# Patient Record
Sex: Female | Born: 1970 | Race: White | Hispanic: No | Marital: Single | Smoking: Former smoker
Health system: Southern US, Community
[De-identification: ages and names within clinical notes are randomized; demographics above are authoritative.]

## PROBLEM LIST (undated history)

## (undated) DIAGNOSIS — G2581 Restless legs syndrome: Secondary | ICD-10-CM

## (undated) DIAGNOSIS — E669 Obesity, unspecified: Secondary | ICD-10-CM

## (undated) DIAGNOSIS — G629 Polyneuropathy, unspecified: Secondary | ICD-10-CM

## (undated) DIAGNOSIS — D649 Anemia, unspecified: Secondary | ICD-10-CM

## (undated) DIAGNOSIS — K219 Gastro-esophageal reflux disease without esophagitis: Secondary | ICD-10-CM

## (undated) HISTORY — DX: Gastro-esophageal reflux disease without esophagitis: K21.9

## (undated) HISTORY — DX: Restless legs syndrome: G25.81

## (undated) HISTORY — DX: Polyneuropathy, unspecified: G62.9

## (undated) HISTORY — DX: Anemia, unspecified: D64.9

## (undated) HISTORY — DX: Obesity, unspecified: E66.9

---
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
## 1997-12-10 ENCOUNTER — Emergency Department (HOSPITAL_COMMUNITY): Admission: EM | Admit: 1997-12-10 | Discharge: 1997-12-10 | Payer: Self-pay | Admitting: Emergency Medicine

## 1998-08-15 ENCOUNTER — Ambulatory Visit (HOSPITAL_COMMUNITY): Admission: RE | Admit: 1998-08-15 | Discharge: 1998-08-15 | Payer: Self-pay | Admitting: Gastroenterology

## 1998-12-02 ENCOUNTER — Ambulatory Visit (HOSPITAL_COMMUNITY): Admission: RE | Admit: 1998-12-02 | Discharge: 1998-12-02 | Payer: Self-pay | Admitting: Gastroenterology

## 2002-07-12 ENCOUNTER — Ambulatory Visit (HOSPITAL_BASED_OUTPATIENT_CLINIC_OR_DEPARTMENT_OTHER): Admission: RE | Admit: 2002-07-12 | Discharge: 2002-07-12 | Payer: Self-pay | Admitting: Family Medicine

## 2004-11-20 ENCOUNTER — Encounter: Admission: RE | Admit: 2004-11-20 | Discharge: 2004-11-20 | Payer: Self-pay | Admitting: Internal Medicine

## 2005-02-27 ENCOUNTER — Encounter: Payer: Self-pay | Admitting: Family Medicine

## 2005-02-27 ENCOUNTER — Emergency Department (HOSPITAL_COMMUNITY): Admission: EM | Admit: 2005-02-27 | Discharge: 2005-02-28 | Payer: Self-pay | Admitting: Emergency Medicine

## 2006-01-13 ENCOUNTER — Other Ambulatory Visit: Admission: RE | Admit: 2006-01-13 | Discharge: 2006-01-13 | Payer: Self-pay | Admitting: Gynecology

## 2006-05-28 ENCOUNTER — Encounter (INDEPENDENT_AMBULATORY_CARE_PROVIDER_SITE_OTHER): Payer: Self-pay | Admitting: Specialist

## 2006-05-28 ENCOUNTER — Ambulatory Visit (HOSPITAL_COMMUNITY): Admission: RE | Admit: 2006-05-28 | Discharge: 2006-05-28 | Payer: Self-pay | Admitting: *Deleted

## 2007-01-17 ENCOUNTER — Other Ambulatory Visit: Admission: RE | Admit: 2007-01-17 | Discharge: 2007-01-17 | Payer: Self-pay | Admitting: Gynecology

## 2009-06-14 ENCOUNTER — Other Ambulatory Visit: Admission: RE | Admit: 2009-06-14 | Discharge: 2009-06-14 | Payer: Self-pay | Admitting: Obstetrics and Gynecology

## 2009-06-14 ENCOUNTER — Ambulatory Visit: Payer: Self-pay | Admitting: Women's Health

## 2010-03-09 ENCOUNTER — Encounter: Payer: Self-pay | Admitting: Internal Medicine

## 2010-04-25 ENCOUNTER — Ambulatory Visit (INDEPENDENT_AMBULATORY_CARE_PROVIDER_SITE_OTHER): Payer: 59 | Admitting: Gynecology

## 2010-04-25 DIAGNOSIS — Z30431 Encounter for routine checking of intrauterine contraceptive device: Secondary | ICD-10-CM

## 2010-05-22 ENCOUNTER — Ambulatory Visit: Payer: 59 | Admitting: Gynecology

## 2010-05-26 ENCOUNTER — Ambulatory Visit: Payer: 59 | Admitting: Gynecology

## 2010-05-30 ENCOUNTER — Ambulatory Visit (INDEPENDENT_AMBULATORY_CARE_PROVIDER_SITE_OTHER): Payer: 59 | Admitting: Gynecology

## 2010-05-30 DIAGNOSIS — Z30431 Encounter for routine checking of intrauterine contraceptive device: Secondary | ICD-10-CM

## 2010-07-04 NOTE — Op Note (Signed)
NAMEALLISSON, SCHINDEL NO.:  [id number]   MEDICAL RECORD NO.:  [id number]          PATIENT TYPE:  AMB   LOCATION:  ENDO                         FACILITY:  [facility]   PHYSICIAN:  Georgiana Spinner, M.D.    DATE OF BIRTH:  05/09/1970   DATE OF PROCEDURE:  05/28/2006  DATE OF DISCHARGE:                               OPERATIVE REPORT   PROCEDURE:  Upper endoscopy with biopsy.   INDICATIONS:  Gastroesophageal reflux disease.   ANESTHESIA:  Fentanyl 75 mcg, Versed 6 mg.   DESCRIPTION OF PROCEDURE:  With the patient mildly sedated in the left  lateral decubitus position, the Pentax videoscopic endoscope was  inserted and passed under direct vision through the esophagus, which  appeared normal except there was one small area of a satellite of  intestinal appearing tissue above the squamocolumnar junction, which was  photographed and biopsied.  We entered into the stomach.  Fundus, body,  antrum, duodenal bulb, second portion duodenum appeared normal.  From  this point the endoscope was slowly withdrawn, taking circumferential  views of duodenal mucosa until the endoscope had been pulled back into  the stomach, placed in retroflexion to view the stomach from below.  The  endoscope was straightened and withdrawn, taking circumferential views  of the remaining gastric and esophageal mucosa.  The patient's vital  signs, pulse oximeter remained stable.  The patient tolerated procedure  well without apparent complications.   FINDINGS:  Question of Barrett's esophagus biopsied.  Await biopsy  report.  The patient will call me for results and follow up with me as  an outpatient.           ______________________________  Georgiana Spinner, M.D.     GMO/MEDQ  D:  05/28/2006  T:  05/28/2006  Job:  [id number]

## 2010-09-01 ENCOUNTER — Encounter: Payer: Self-pay | Admitting: *Deleted

## 2010-09-12 ENCOUNTER — Encounter: Payer: 59 | Admitting: Women's Health

## 2011-03-16 ENCOUNTER — Encounter: Payer: Self-pay | Admitting: Women's Health

## 2011-03-16 ENCOUNTER — Ambulatory Visit (INDEPENDENT_AMBULATORY_CARE_PROVIDER_SITE_OTHER): Payer: 59 | Admitting: Women's Health

## 2011-03-16 VITALS — BP 120/80 | Ht 62.0 in | Wt 252.0 lb

## 2011-03-16 DIAGNOSIS — Z01419 Encounter for gynecological examination (general) (routine) without abnormal findings: Secondary | ICD-10-CM

## 2011-03-16 NOTE — Progress Notes (Signed)
Debra Gibson [birth year] [id number]    History:    The patient presents for annual exam.  Amenorrheic on Mirena IUD, placed March 2012. History of normal Paps.  Past medical history, past surgical history, family history and social history were all reviewed and documented in the EPIC chart. Diabetes, anxiety/depression, reflux, primary care manages.   ROS:  A  ROS was performed and pertinent positives and negatives are included in the history.  Exam:  Filed Vitals:   03/16/11 1501  BP: 120/80    General appearance:  Normal Head/Neck:  Normal, without cervical or supraclavicular adenopathy. Thyroid:  Symmetrical, normal in size, without palpable masses or nodularity. Respiratory  Effort:  Normal  Auscultation:  Clear without wheezing or rhonchi Cardiovascular  Auscultation:  Regular rate, without rubs, murmurs or gallops  Edema/varicosities:  Not grossly evident Abdominal  Soft,nontender, without masses, guarding or rebound.  Liver/spleen:  No organomegaly noted  Hernia:  None appreciated  Skin  Inspection:  Grossly normal  Palpation:  Grossly normal Neurologic/psychiatric  Orientation:  Normal with appropriate conversation.  Mood/affect:  Normal  Genitourinary    Breasts: Examined lying and sitting.     Right: Without masses, retractions, discharge or axillary adenopathy.     Left: Without masses, retractions, discharge or axillary adenopathy.   Inguinal/mons:  Normal without inguinal adenopathy  External genitalia:  Normal  BUS/Urethra/Skene's glands:  Normal  Bladder:  Normal  Vagina:  Normal  Cervix:  Normal/IUD string visible  Uterus:   normal in size, shape and contour.  Midline and mobile  Adnexa/parametria:     Rt: Without masses or tenderness.   Lt: Without masses or tenderness.  Anus and perineum: Normal  Digital rectal exam: Normal sphincter tone without palpated masses or tenderness  Assessment/Plan:  41 y.o. SWF G1 P1 for annual exam.   Normal GYN  exam/Mirena IUD Morbid obesity Diabetes/anxiety/depression/reflux-primary care labs and medications  Plan: Condoms encouraged to become sexually active. Aware  Mirena IUD is good for 5 years. SBEs, annual mammogram at 42. Long discussion on importance of weight loss and relationship to health. Encouraged to cut calories, Weight Watchers, counseling for anxiety and depression. Debra Gibson  Whitts name and number was given.    Harrington Challenger WHNP, 6:00 PM 03/16/2011

## 2011-12-24 ENCOUNTER — Ambulatory Visit (INDEPENDENT_AMBULATORY_CARE_PROVIDER_SITE_OTHER): Payer: 59 | Admitting: General Surgery

## 2011-12-24 ENCOUNTER — Other Ambulatory Visit (INDEPENDENT_AMBULATORY_CARE_PROVIDER_SITE_OTHER): Payer: Self-pay

## 2011-12-24 ENCOUNTER — Encounter (INDEPENDENT_AMBULATORY_CARE_PROVIDER_SITE_OTHER): Payer: Self-pay | Admitting: General Surgery

## 2011-12-24 VITALS — BP 142/80 | HR 92 | Temp 97.2°F | Resp 16 | Ht 63.0 in | Wt 246.0 lb

## 2011-12-24 DIAGNOSIS — Z6841 Body Mass Index (BMI) 40.0 and over, adult: Secondary | ICD-10-CM

## 2011-12-24 DIAGNOSIS — E119 Type 2 diabetes mellitus without complications: Secondary | ICD-10-CM

## 2011-12-24 DIAGNOSIS — K219 Gastro-esophageal reflux disease without esophagitis: Secondary | ICD-10-CM

## 2011-12-24 LAB — COMPREHENSIVE METABOLIC PANEL
ALT: 19 U/L (ref 0–35)
AST: 15 U/L (ref 0–37)
Albumin: 4.1 g/dL (ref 3.5–5.2)
Alkaline Phosphatase: 59 U/L (ref 39–117)
BUN: 12 mg/dL (ref 6–23)
CO2: 26 mEq/L (ref 19–32)
Calcium: 9.4 mg/dL (ref 8.4–10.5)
Chloride: 99 mEq/L (ref 96–112)
Creat: 0.72 mg/dL (ref 0.50–1.10)
Glucose, Bld: 182 mg/dL — ABNORMAL HIGH (ref 70–99)
Potassium: 4.3 mEq/L (ref 3.5–5.3)
Sodium: 136 mEq/L (ref 135–145)
Total Bilirubin: 0.3 mg/dL (ref 0.3–1.2)
Total Protein: 7.2 g/dL (ref 6.0–8.3)

## 2011-12-24 LAB — CBC WITH DIFFERENTIAL/PLATELET
Basophils Relative: 0 % (ref 0–1)
Eosinophils Absolute: 0.4 10*3/uL (ref 0.0–0.7)
HCT: 33.6 % — ABNORMAL LOW (ref 36.0–46.0)
Hemoglobin: 10.8 g/dL — ABNORMAL LOW (ref 12.0–15.0)
Lymphs Abs: 2.8 10*3/uL (ref 0.7–4.0)
MCH: 23.5 pg — ABNORMAL LOW (ref 26.0–34.0)
MCHC: 32.1 g/dL (ref 30.0–36.0)
MCV: 73 fL — ABNORMAL LOW (ref 78.0–100.0)
Monocytes Absolute: 0.6 10*3/uL (ref 0.1–1.0)
Monocytes Relative: 5 % (ref 3–12)
Neutrophils Relative %: 68 % (ref 43–77)
RBC: 4.6 MIL/uL (ref 3.87–5.11)

## 2011-12-24 LAB — LIPID PANEL
Cholesterol: 178 mg/dL (ref 0–200)
HDL: 33 mg/dL — ABNORMAL LOW (ref >39)
LDL Cholesterol: 102 mg/dL — ABNORMAL HIGH (ref 0–99)
Total CHOL/HDL Ratio: 5.4 Ratio
Triglycerides: 215 mg/dL — ABNORMAL HIGH (ref <150)
VLDL: 43 mg/dL — ABNORMAL HIGH (ref 0–40)

## 2011-12-24 LAB — TSH: TSH: 3.1 u[IU]/mL (ref 0.350–4.500)
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
## 2011-12-25 LAB — H. PYLORI ANTIBODY, IGG: H Pylori IgG: 0.53 {ISR}

## 2012-01-21 ENCOUNTER — Ambulatory Visit: Payer: 59 | Admitting: *Deleted

## 2012-09-12 ENCOUNTER — Ambulatory Visit: Payer: 59 | Admitting: Neurology

## 2012-10-07 ENCOUNTER — Other Ambulatory Visit (HOSPITAL_COMMUNITY)
Admission: RE | Admit: 2012-10-07 | Discharge: 2012-10-07 | Disposition: A | Discharge status: Home / Self Care | Payer: 59 | Source: Ambulatory Visit | Attending: Gynecology | Admitting: Gynecology

## 2012-10-07 ENCOUNTER — Encounter: Payer: Self-pay | Admitting: Women's Health

## 2012-10-07 ENCOUNTER — Ambulatory Visit (INDEPENDENT_AMBULATORY_CARE_PROVIDER_SITE_OTHER): Payer: 59 | Admitting: Women's Health

## 2012-10-07 VITALS — BP 118/82 | Ht 62.0 in | Wt 235.0 lb

## 2012-10-07 DIAGNOSIS — F329 Major depressive disorder, single episode, unspecified: Secondary | ICD-10-CM | POA: Insufficient documentation

## 2012-10-07 DIAGNOSIS — E119 Type 2 diabetes mellitus without complications: Secondary | ICD-10-CM

## 2012-10-07 DIAGNOSIS — IMO0001 Reserved for inherently not codable concepts without codable children: Secondary | ICD-10-CM

## 2012-10-07 DIAGNOSIS — R5383 Other fatigue: Secondary | ICD-10-CM

## 2012-10-07 DIAGNOSIS — J45909 Unspecified asthma, uncomplicated: Secondary | ICD-10-CM | POA: Insufficient documentation

## 2012-10-07 DIAGNOSIS — F341 Dysthymic disorder: Secondary | ICD-10-CM

## 2012-10-07 DIAGNOSIS — Z01419 Encounter for gynecological examination (general) (routine) without abnormal findings: Secondary | ICD-10-CM

## 2012-10-07 DIAGNOSIS — E118 Type 2 diabetes mellitus with unspecified complications: Secondary | ICD-10-CM | POA: Insufficient documentation

## 2012-10-07 DIAGNOSIS — I10 Essential (primary) hypertension: Secondary | ICD-10-CM

## 2012-10-07 DIAGNOSIS — R5381 Other malaise: Secondary | ICD-10-CM

## 2012-10-07 LAB — CBC WITH DIFFERENTIAL/PLATELET
Basophils Absolute: 0 10*3/uL (ref 0.0–0.1)
Basophils Relative: 0 % (ref 0–1)
Eosinophils Absolute: 0.3 10*3/uL (ref 0.0–0.7)
Eosinophils Relative: 3 % (ref 0–5)
HCT: 36.8 % (ref 36.0–46.0)
Hemoglobin: 11.9 g/dL — ABNORMAL LOW (ref 12.0–15.0)
Lymphocytes Relative: 22 % (ref 12–46)
Lymphs Abs: 2.2 10*3/uL (ref 0.7–4.0)
MCH: 25.6 pg — ABNORMAL LOW (ref 26.0–34.0)
MCHC: 32.3 g/dL (ref 30.0–36.0)
MCV: 79.3 fL (ref 78.0–100.0)
Monocytes Absolute: 0.6 10*3/uL (ref 0.1–1.0)
Monocytes Relative: 6 % (ref 3–12)
Neutro Abs: 7 10*3/uL (ref 1.7–7.7)
Neutrophils Relative %: 69 % (ref 43–77)
Platelets: 383 10*3/uL (ref 150–400)
RBC: 4.64 MIL/uL (ref 3.87–5.11)
RDW: 15.9 % — ABNORMAL HIGH (ref 11.5–15.5)
WBC: 10.1 10*3/uL (ref 4.0–10.5)

## 2012-10-07 LAB — HEMOGLOBIN A1C
Hgb A1c MFr Bld: 7.6 % — ABNORMAL HIGH (ref <5.7)
Mean Plasma Glucose: 171 mg/dL — ABNORMAL HIGH (ref <117)

## 2012-10-07 NOTE — Progress Notes (Signed)
Debra Gibson 06/09/1970 [id number]    History:    The patient presents for annual exam.  Amenorrheic, Mirena IUD placed 04/2010. Normal Pap history. Diabetes/hypertension/reflux/asthma/anxiety and depression-primary care manages. Complaint of extreme fatigue.  Past medical history, past surgical history, family history and social history were all reviewed and documented in the EPIC chart. Works at [employer]. Racinda 20 doing well at [facility]. Sister [place] lives with her. Parents diabetes and hypertension. Both deceased.   ROS:  A  ROS was performed and pertinent positives and negatives are included in the history.  Exam:  Filed Vitals:   10/07/12 1611  BP: 118/82    General appearance:  Normal Head/Neck:  Normal, without cervical or supraclavicular adenopathy. Thyroid:  Symmetrical, normal in size, without palpable masses or nodularity. Respiratory  Effort:  Normal  Auscultation:  Clear without wheezing or rhonchi Cardiovascular  Auscultation:  Regular rate, without rubs, murmurs or gallops  Edema/varicosities:  Not grossly evident Abdominal  Soft,nontender, without masses, guarding or rebound.  Liver/spleen:  No organomegaly noted  Hernia:  None appreciated  Skin  Inspection:  Grossly normal  Palpation:  Grossly normal Neurologic/psychiatric  Orientation:  Normal with appropriate conversation.  Mood/affect:  Normal  Genitourinary    Breasts: Examined lying and sitting.     Right: Without masses, retractions, discharge or axillary adenopathy.     Left: Without masses, retractions, discharge or axillary adenopathy.   Inguinal/mons:  Normal without inguinal adenopathy  External genitalia:  Normal  BUS/Urethra/Skene's glands:  Normal  Bladder:  Normal  Vagina:  Normal  Cervix:  Normal IUD strings in os  Uterus:   normal in size, shape and contour.  Midline and mobile  Adnexa/parametria:     Rt: Without masses or tenderness.   Lt: Without masses or  tenderness.  Anus and perineum: Normal  Digital rectal exam: Normal sphincter tone without palpated masses or tenderness  Assessment/Plan:  42 y.o. SWF G2P1 for annual exam with complaint of extreme fatigue.  Fatigue Diabetes/hypertension/reflux/asthma/anxiety and depression-primary care labs and meds Obesity Mirena IUD placed 04/2010-amenorrheic  Plan: Condoms encouraged if sexually active. SBE's, annual mammogram encouraged, breast center number given instructed to schedule. Reviewed importance of increasing regular exercise and decreasing calories for weight loss for health, down 20 pounds in last year. Will schedule a nutritional consult. CBC, TSH, hemoglobin A1c, Pap, will take labs to next scheduled appointment with primary care. Condoms encouraged if become sexually active.     Harrington Challenger WHNP, 5:22 PM 10/07/2012

## 2012-10-07 NOTE — Patient Instructions (Addendum)

## 2012-10-08 LAB — TSH: TSH: 1.959 u[IU]/mL (ref 0.350–4.500)

## 2012-10-10 ENCOUNTER — Telehealth: Payer: Self-pay | Admitting: *Deleted

## 2012-10-10 DIAGNOSIS — E119 Type 2 diabetes mellitus without complications: Secondary | ICD-10-CM

## 2012-10-10 DIAGNOSIS — E669 Obesity, unspecified: Secondary | ICD-10-CM

## 2012-10-10 NOTE — Telephone Encounter (Signed)
Referral placed pt will be contact with time and date.

## 2012-10-10 NOTE — Telephone Encounter (Signed)
Message copied by Aura Camps on Mon Oct 10, 2012  8:31 AM ------      Message from: [place], [place] J      Created: Fri Oct 07, 2012  5:33 PM       Please schedule her  nutritional counseling appointment for diabetes and obesity. Afternoons are best for her. ------

## 2012-11-14 NOTE — Telephone Encounter (Signed)
Spoke with Renee at [employer] has been called twice to return call to schedule,pt has never called back.

## 2012-12-22 ENCOUNTER — Other Ambulatory Visit: Payer: Self-pay

## 2012-12-29 ENCOUNTER — Encounter: Payer: Self-pay | Admitting: *Deleted

## 2012-12-29 ENCOUNTER — Encounter: Payer: 59 | Attending: Women's Health | Admitting: *Deleted

## 2012-12-29 VITALS — Ht 62.0 in | Wt 239.2 lb

## 2012-12-29 DIAGNOSIS — Z713 Dietary counseling and surveillance: Secondary | ICD-10-CM | POA: Insufficient documentation

## 2012-12-29 DIAGNOSIS — IMO0001 Reserved for inherently not codable concepts without codable children: Secondary | ICD-10-CM

## 2012-12-29 NOTE — Progress Notes (Signed)
Medical Nutrition Therapy:  Appt start time: 1600 end time:  1700.  Assessment:  Patient here today for diabetes education. She has had diabetes for the last couple of years. HgbA1c on August 22 was 7.6, which was down from 8.3 1 year ago. She checks her blood glucose in the morning, with values around 135-140. She reports that she has never met with an RD, and wants to know how to eat better to lower her blood glucose. She eats out frequently, choosing unhealthier options more frequently. She doesn't exercise regularly. She does feel overwhelmed about making dietary changes, so we focused solely on becoming familiar with carbohydrate foods and portion control.   MEDICATIONS: Reviewed. Diabetes medications include Onglyza in the morning, glyburide-metformin twice daily   DIETARY INTAKE:   Usual eating pattern includes 2 meals and 3 snacks per day.  24-hr recall:  B ( AM): Protein shake (Slim Fast)  Snk ( AM): Sometimes, grapes, chips, pretzels  L ( PM): Usually skips Snk ( PM): Same as AM D ( PM): Eats out: McDonald's (McChicken sandwich, fries, sweet tea), [employer] (chicken quesadilla, sweet tea) Snk ( PM): Sometimes, same, chocolate Beverages: Sweet tea, diet Mountain Dew  Usual physical activity: Occasionally, not regular, walking  Estimated energy needs: 1500 calories 168 g carbohydrates 94 g protein 50 g fat  Progress Towards Goal(s):  In progress.   Nutritional Diagnosis:  NB-1.1 Food and nutrition-related knowledge deficit As related to diabetes.  As evidenced by no prior education.    Intervention:  Nutrition counseling. We discussed basic carb counting, including foods with carbs, label reading, portion size, and meal planning. We also briefly reviewed overall healthy eating.   Goals:  1. 3 carb servings at meals +/- 1 serving, 1 serving at snacks.  2. Monitor portion size of carbohydrate foods.  3. Read food labels for carbohydrate content and serving size.  4. Limit  intake of sweet tea.  5. Be mindful of making healthy choices, but focusing on carb intake only is the main focus.   Handouts given during visit include:  Living Well With Diabetes  Yellow meal plan card  Monitoring/Evaluation:  Dietary intake, exercise, blood glucose, and body weight in 2 month(s).

## 2013-03-30 ENCOUNTER — Ambulatory Visit: Payer: 59 | Admitting: *Deleted

## 2013-12-18 ENCOUNTER — Encounter: Payer: Self-pay | Admitting: *Deleted

## 2014-02-05 ENCOUNTER — Ambulatory Visit (INDEPENDENT_AMBULATORY_CARE_PROVIDER_SITE_OTHER): Payer: 59 | Admitting: Women's Health

## 2014-02-05 ENCOUNTER — Encounter: Payer: Self-pay | Admitting: Women's Health

## 2014-02-05 VITALS — BP 124/82 | Ht 62.0 in | Wt 228.0 lb

## 2014-02-05 DIAGNOSIS — Z01419 Encounter for gynecological examination (general) (routine) without abnormal findings: Secondary | ICD-10-CM

## 2014-02-05 NOTE — Patient Instructions (Signed)

## 2014-02-05 NOTE — Progress Notes (Signed)
Debra Gibson Apr 29, 1970 [id number]    History:    Presents for annual exam.  Rare bleeding Mirena IUD placed 04/2010. Not sexually active in years. Normal Pap and mammogram history. Diabetes/anxiety and depression/asthma/hypertension primary care manages labs and meds.  Past medical history, past surgical history, family history and social history were all reviewed and documented in the EPIC chart. Works from home. Parents diabetes and hypertension.   ROS:  A ROS was performed and pertinent positives and negatives are included.  Exam:  Filed Vitals:   02/05/14 1053  BP: 124/82    General appearance:  Normal Thyroid:  Symmetrical, normal in size, without palpable masses or nodularity. Respiratory  Auscultation:  Clear without wheezing or rhonchi Cardiovascular  Auscultation:  Regular rate, without rubs, murmurs or gallops  Edema/varicosities:  Not grossly evident Abdominal  Soft,nontender, without masses, guarding or rebound.  Liver/spleen:  No organomegaly noted  Hernia:  None appreciated  Skin  Inspection:  Grossly normal   Breasts: Examined lying and sitting.     Right: Without masses, retractions, discharge or axillary adenopathy.     Left: Without masses, retractions, discharge or axillary adenopathy. Gentitourinary   Inguinal/mons:  Normal without inguinal adenopathy  External genitalia:  Normal  BUS/Urethra/Skene's glands:  Normal  Vagina:  Normal  Cervix:  Normal IUD string noted in os  Uterus:   normal in size, shape and contour.  Midline and mobile  Adnexa/parametria:     Rt: Without masses or tenderness.   Lt: Without masses or tenderness.  Anus and perineum: Normal  Digital rectal exam: Normal sphincter tone without palpated masses or tenderness  Assessment/Plan:  43 y.o. S WF G1 P1 for annual exam.    04/2010 Mirena IUD-rare bleeding Diabetes/hypertension/anxiety and depression/asthma-primary care manages labs and meds Obesity  Plan: SBE's, continue  annual screening mammogram, calcium rich diet, vitamin D 1000 daily encouraged. Reviewed importance of increasing exercise and decreasing calories for weight loss. Currently in a program through work for healthier living. Pap normal 2014, new screening guidelines reviewed. Condoms encouraged if sexually active.    Debra ChallengerYOUNG,Debra Gibson J [facility] CampusWHNP, 11:24 AM 02/05/2014

## 2014-02-06 LAB — URINALYSIS W MICROSCOPIC + REFLEX CULTURE
Bacteria, UA: NONE SEEN
Bilirubin Urine: NEGATIVE
Casts: NONE SEEN
Crystals: NONE SEEN
Glucose, UA: 250 mg/dL — AB
Ketones, ur: NEGATIVE mg/dL
Leukocytes, UA: NEGATIVE
Nitrite: NEGATIVE
Protein, ur: NEGATIVE mg/dL
Specific Gravity, Urine: 1.026 (ref 1.005–1.030)
Squamous Epithelial / HPF: NONE SEEN
Urobilinogen, UA: 0.2 mg/dL (ref 0.0–1.0)
pH: 5.5 (ref 5.0–8.0)

## 2014-08-13 ENCOUNTER — Other Ambulatory Visit: Payer: Self-pay

## 2015-01-17 ENCOUNTER — Ambulatory Visit (INDEPENDENT_AMBULATORY_CARE_PROVIDER_SITE_OTHER): Payer: 59 | Admitting: Neurology

## 2015-01-17 ENCOUNTER — Encounter: Payer: Self-pay | Admitting: Neurology

## 2015-01-17 VITALS — BP 150/96 | HR 80 | Resp 16 | Ht 62.0 in | Wt 225.0 lb

## 2015-01-17 DIAGNOSIS — G2581 Restless legs syndrome: Secondary | ICD-10-CM

## 2015-01-17 DIAGNOSIS — G4719 Other hypersomnia: Secondary | ICD-10-CM

## 2015-01-17 DIAGNOSIS — E669 Obesity, unspecified: Secondary | ICD-10-CM

## 2015-01-17 DIAGNOSIS — G4761 Periodic limb movement disorder: Secondary | ICD-10-CM

## 2015-01-17 DIAGNOSIS — E0842 Diabetes mellitus due to underlying condition with diabetic polyneuropathy: Secondary | ICD-10-CM | POA: Diagnosis not present

## 2015-01-17 DIAGNOSIS — R351 Nocturia: Secondary | ICD-10-CM
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
## 2015-01-17 NOTE — Patient Instructions (Signed)
You may have a combination of neuropathy and restless legs. We may try something else in the near future. Please call us back with your ropinirole dose.   I would like to investigate things further to look for evidence of neuropathy or nerve damage; therefore, I would like to review your recent blood work, and we will do electrical testing of your muscles and nerves, which is known as EMG/NCV. Neuropathy or nerve disease or damage can be caused by a variety of causes, most commonly diabetes, some toxins including alcohol or metabolic derangements or hereditary disorders.   Your RLS symptoms and leg or lower body twitching may in part be worse due to your sertraline.   Based on your symptoms and your exam I believe you may also be at risk for obstructive sleep apnea or OSA, and I think we should proceed with a sleep study to determine whether you do or do not have OSA and how severe it is. If you have more than mild OSA, I want you to consider treatment with CPAP. Please remember, the risks and ramifications of moderate to severe obstructive sleep apnea or OSA are: Cardiovascular disease, including congestive heart failure, stroke, difficult to control hypertension, arrhythmias, and even type 2 diabetes has been linked to untreated OSA. Sleep apnea causes disruption of sleep and sleep deprivation in most cases, which, in turn, can cause recurrent headaches, problems with memory, mood, concentration, focus, and vigilance. Most people with untreated sleep apnea report excessive daytime sleepiness, which can affect their ability to drive. Please do not drive if you feel sleepy.   I will likely see you back after your sleep study to go over the test results and where to go from there. We will call you after your sleep study to advise about the results (most likely, you will hear from Lafonda Mossesiana, my nurse) and to set up an appointment at the time, as necessary.    Our sleep lab administrative assistant, Alvis LemmingsDawn will  meet with you or call you to schedule your sleep study. If you don't hear back from her by next week please feel free to call her at [phone]. This is her direct line and please leave a message with your phone number to call back if you get the voicemail box. She will call back as soon as possible.

## 2015-01-21 ENCOUNTER — Telehealth: Payer: Self-pay | Admitting: Neurology

## 2015-01-21 NOTE — Telephone Encounter (Signed)
Please call patient back and ask her to take ropinirole 0.5 mg, 1-1/2 pills, which is a total dose of 0.75 mg once at night, 90 minutes to 2 hours before bedtime.

## 2015-01-21 NOTE — Telephone Encounter (Signed)
Pt called sts Dr Frances FurbishAthar wanted to know dosage for roprinole, it is 0.5 mg.

## 2015-01-21 NOTE — Telephone Encounter (Signed)
I called patient back and confirmed that she is taking the Ropinirole 0.5mg  once a day.

## 2015-01-21 NOTE — Telephone Encounter (Signed)
Patient is aware of new instructions. She does not need a refill right now but will call us when she does.

## 2015-02-12 ENCOUNTER — Ambulatory Visit (INDEPENDENT_AMBULATORY_CARE_PROVIDER_SITE_OTHER): Payer: 59 | Admitting: Neurology

## 2015-02-12 DIAGNOSIS — G4720 Circadian rhythm sleep disorder, unspecified type: Secondary | ICD-10-CM

## 2015-02-12 DIAGNOSIS — G4733 Obstructive sleep apnea (adult) (pediatric): Secondary | ICD-10-CM | POA: Diagnosis not present

## 2015-02-12 DIAGNOSIS — G4761 Periodic limb movement disorder: Secondary | ICD-10-CM

## 2015-02-13 NOTE — Sleep Study (Signed)
Please see the scanned sleep study interpretation located in the procedure tab in the chart view section.  

## 2015-02-14 ENCOUNTER — Telehealth: Payer: Self-pay | Admitting: Neurology

## 2015-02-14 ENCOUNTER — Ambulatory Visit (INDEPENDENT_AMBULATORY_CARE_PROVIDER_SITE_OTHER): Payer: 59 | Admitting: Women's Health

## 2015-02-14 ENCOUNTER — Encounter: Payer: Self-pay | Admitting: Women's Health

## 2015-02-14 ENCOUNTER — Other Ambulatory Visit (HOSPITAL_COMMUNITY)
Admission: RE | Admit: 2015-02-14 | Discharge: 2015-02-14 | Disposition: A | Discharge status: Home / Self Care | Payer: 59 | Source: Ambulatory Visit | Attending: Women's Health | Admitting: Women's Health

## 2015-02-14 VITALS — BP 136/80 | Ht 62.0 in | Wt 224.0 lb

## 2015-02-14 DIAGNOSIS — Z1151 Encounter for screening for human papillomavirus (HPV): Secondary | ICD-10-CM | POA: Insufficient documentation

## 2015-02-14 DIAGNOSIS — Z01419 Encounter for gynecological examination (general) (routine) without abnormal findings: Secondary | ICD-10-CM

## 2015-02-14 NOTE — Patient Instructions (Signed)
Health Maintenance, Female Adopting a healthy lifestyle and getting preventive care can go a long way to promote health and wellness. Talk with your health care provider about what schedule of regular examinations is right for you. This is a good chance for you to check in with your provider about disease prevention and staying healthy. In between checkups, there are plenty of things you can do on your own. Experts have done a lot of research about which lifestyle changes and preventive measures are most likely to keep you healthy. Ask your health care provider for more information. WEIGHT AND DIET  Eat a healthy diet  Be sure to include plenty of vegetables, fruits, low-fat dairy products, and lean protein.  Do not eat a lot of foods high in solid fats, added sugars, or salt.  Get regular exercise. This is one of the most important things you can do for your health.  Most adults should exercise for at least 150 minutes each week. The exercise should increase your heart rate and make you sweat (moderate-intensity exercise).  Most adults should also do strengthening exercises at least twice a week. This is in addition to the moderate-intensity exercise.  Maintain a healthy weight  Body mass index (BMI) is a measurement that can be used to identify possible weight problems. It estimates body fat based on height and weight. Your health care provider can help determine your BMI and help you achieve or maintain a healthy weight.  For females 20 years of age and older:   A BMI below 18.5 is considered underweight.  A BMI of 18.5 to 24.9 is normal.  A BMI of 25 to 29.9 is considered overweight.  A BMI of 30 and above is considered obese.  Watch levels of cholesterol and blood lipids  You should start having your blood tested for lipids and cholesterol at 42 years of age, then have this test every 5 years.  You may need to have your cholesterol levels checked more often if:  Your lipid  or cholesterol levels are high.  You are older than 42 years of age.  You are at high risk for heart disease.  CANCER SCREENING   Lung Cancer  Lung cancer screening is recommended for adults 55-80 years old who are at high risk for lung cancer because of a history of smoking.  A yearly low-dose CT scan of the lungs is recommended for people who:  Currently smoke.  Have quit within the past 15 years.  Have at least a 30-pack-year history of smoking. A pack year is smoking an average of one pack of cigarettes a day for 1 year.  Yearly screening should continue until it has been 15 years since you quit.  Yearly screening should stop if you develop a health problem that would prevent you from having lung cancer treatment.  Breast Cancer  Practice breast self-awareness. This means understanding how your breasts normally appear and feel.  It also means doing regular breast self-exams. Let your health care provider know about any changes, no matter how small.  If you are in your 20s or 30s, you should have a clinical breast exam (CBE) by a health care provider every 1-3 years as part of a regular health exam.  If you are 40 or older, have a CBE every year. Also consider having a breast X-ray (mammogram) every year.  If you have a family history of breast cancer, talk to your health care provider about genetic screening.  If you   are at high risk for breast cancer, talk to your health care provider about having an MRI and a mammogram every year.  Breast cancer gene (BRCA) assessment is recommended for women who have family members with BRCA-related cancers. BRCA-related cancers include:  Breast.  Ovarian.  Tubal.  Peritoneal cancers.  Results of the assessment will determine the need for genetic counseling and BRCA1 and BRCA2 testing. Cervical Cancer Your health care provider may recommend that you be screened regularly for cancer of the pelvic organs (ovaries, uterus, and  vagina). This screening involves a pelvic examination, including checking for microscopic changes to the surface of your cervix (Pap test). You may be encouraged to have this screening done every 3 years, beginning at age 21.  For women ages 30-65, health care providers may recommend pelvic exams and Pap testing every 3 years, or they may recommend the Pap and pelvic exam, combined with testing for human papilloma virus (HPV), every 5 years. Some types of HPV increase your risk of cervical cancer. Testing for HPV may also be done on women of any age with unclear Pap test results.  Other health care providers may not recommend any screening for nonpregnant women who are considered low risk for pelvic cancer and who do not have symptoms. Ask your health care provider if a screening pelvic exam is right for you.  If you have had past treatment for cervical cancer or a condition that could lead to cancer, you need Pap tests and screening for cancer for at least 20 years after your treatment. If Pap tests have been discontinued, your risk factors (such as having a new sexual partner) need to be reassessed to determine if screening should resume. Some women have medical problems that increase the chance of getting cervical cancer. In these cases, your health care provider may recommend more frequent screening and Pap tests. Colorectal Cancer  This type of cancer can be detected and often prevented.  Routine colorectal cancer screening usually begins at 42 years of age and continues through 42 years of age.  Your health care provider may recommend screening at an earlier age if you have risk factors for colon cancer.  Your health care provider may also recommend using home test kits to check for hidden blood in the stool.  A small camera at the end of a tube can be used to examine your colon directly (sigmoidoscopy or colonoscopy). This is done to check for the earliest forms of colorectal  cancer.  Routine screening usually begins at age 50.  Direct examination of the colon should be repeated every 5-10 years through 42 years of age. However, you may need to be screened more often if early forms of precancerous polyps or small growths are found. Skin Cancer  Check your skin from head to toe regularly.  Tell your health care provider about any new moles or changes in moles, especially if there is a change in a mole's shape or color.  Also tell your health care provider if you have a mole that is larger than the size of a pencil eraser.  Always use sunscreen. Apply sunscreen liberally and repeatedly throughout the day.  Protect yourself by wearing long sleeves, pants, a wide-brimmed hat, and sunglasses whenever you are outside. HEART DISEASE, DIABETES, AND HIGH BLOOD PRESSURE   High blood pressure causes heart disease and increases the risk of stroke. High blood pressure is more likely to develop in:  People who have blood pressure in the high end   of the normal range (130-139/85-89 mm Hg).  People who are overweight or obese.  People who are African American.  If you are 38-23 years of age, have your blood pressure checked every 3-5 years. If you are 61 years of age or older, have your blood pressure checked every year. You should have your blood pressure measured twice--once when you are at a hospital or clinic, and once when you are not at a hospital or clinic. Record the average of the two measurements. To check your blood pressure when you are not at a hospital or clinic, you can use:  An automated blood pressure machine at a pharmacy.  A home blood pressure monitor.  If you are between 45 years and 39 years old, ask your health care provider if you should take aspirin to prevent strokes.  Have regular diabetes screenings. This involves taking a blood sample to check your fasting blood sugar level.  If you are at a normal weight and have a low risk for diabetes,  have this test once every three years after 42 years of age.  If you are overweight and have a high risk for diabetes, consider being tested at a younger age or more often. PREVENTING INFECTION  Hepatitis B  If you have a higher risk for hepatitis B, you should be screened for this virus. You are considered at high risk for hepatitis B if:  You were born in a country where hepatitis B is common. Ask your health care provider which countries are considered high risk.  Your parents were born in a high-risk country, and you have not been immunized against hepatitis B (hepatitis B vaccine).  You have HIV or AIDS.  You use needles to inject street drugs.  You live with someone who has hepatitis B.  You have had sex with someone who has hepatitis B.  You get hemodialysis treatment.  You take certain medicines for conditions, including cancer, organ transplantation, and autoimmune conditions. Hepatitis C  Blood testing is recommended for:  Everyone born from [birth year] through 1963.  Anyone with known risk factors for hepatitis C. Sexually transmitted infections (STIs)  You should be screened for sexually transmitted infections (STIs) including gonorrhea and chlamydia if:  You are sexually active and are younger than 42 years of age.  You are older than 42 years of age and your health care provider tells you that you are at risk for this type of infection.  Your sexual activity has changed since you were last screened and you are at an increased risk for chlamydia or gonorrhea. Ask your health care provider if you are at risk.  If you do not have HIV, but are at risk, it may be recommended that you take a prescription medicine daily to prevent HIV infection. This is called pre-exposure prophylaxis (PrEP). You are considered at risk if:  You are sexually active and do not regularly use condoms or know the HIV status of your partner(s).  You take drugs by injection.  You are sexually  active with a partner who has HIV. Talk with your health care provider about whether you are at high risk of being infected with HIV. If you choose to begin PrEP, you should first be tested for HIV. You should then be tested every 3 months for as long as you are taking PrEP.  PREGNANCY   If you are premenopausal and you may become pregnant, ask your health care provider about preconception counseling.  If you may  become pregnant, take 400 to 800 micrograms (mcg) of folic acid every day.  If you want to prevent pregnancy, talk to your health care provider about birth control (contraception). OSTEOPOROSIS AND MENOPAUSE   Osteoporosis is a disease in which the bones lose minerals and strength with aging. This can result in serious bone fractures. Your risk for osteoporosis can be identified using a bone density scan.  If you are 65 years of age or older, or if you are at risk for osteoporosis and fractures, ask your health care provider if you should be screened.  Ask your health care provider whether you should take a calcium or vitamin D supplement to lower your risk for osteoporosis.  Menopause may have certain physical symptoms and risks.  Hormone replacement therapy may reduce some of these symptoms and risks. Talk to your health care provider about whether hormone replacement therapy is right for you.  HOME CARE INSTRUCTIONS   Schedule regular health, dental, and eye exams.  Stay current with your immunizations.   Do not use any tobacco products including cigarettes, chewing tobacco, or electronic cigarettes.  If you are pregnant, do not drink alcohol.  If you are breastfeeding, limit how much and how often you drink alcohol.  Limit alcohol intake to no more than 1 drink per day for nonpregnant women. One drink equals 12 ounces of beer, 5 ounces of wine, or 1 ounces of hard liquor.  Do not use street drugs.  Do not share needles.  Ask your health care provider for help if  you need support or information about quitting drugs.  Tell your health care provider if you often feel depressed.  Tell your health care provider if you have ever been abused or do not feel safe at home.   This information is not intended to replace advice given to you by your health care provider. Make sure you discuss any questions you have with your health care provider.   Document Released: 08/18/2010 Document Revised: 02/23/2014 Document Reviewed: 01/04/2013 Elsevier Interactive Patient Education 2016 [employer] Diabetes Mellitus and Food It is important for you to manage your blood sugar (glucose) level. Your blood glucose level can be greatly affected by what you eat. Eating healthier foods in the appropriate amounts throughout the day at about the same time each day will help you control your blood glucose level. It can also help slow or prevent worsening of your diabetes mellitus. Healthy eating may even help you improve the level of your blood pressure and reach or maintain a healthy weight.  General recommendations for healthful eating and cooking habits include:  Eating meals and snacks regularly. Avoid going long periods of time without eating to lose weight.  Eating a diet that consists mainly of plant-based foods, such as fruits, vegetables, nuts, legumes, and whole grains.  Using low-heat cooking methods, such as baking, instead of high-heat cooking methods, such as deep frying. Work with your dietitian to make sure you understand how to use the Nutrition Facts information on food labels. HOW CAN FOOD AFFECT ME? Carbohydrates Carbohydrates affect your blood glucose level more than any other type of food. Your dietitian will help you determine how many carbohydrates to eat at each meal and teach you how to count carbohydrates. Counting carbohydrates is important to keep your blood glucose at a healthy level, especially if you are using insulin or taking certain medicines for  diabetes mellitus. Alcohol Alcohol can cause sudden decreases in blood glucose (hypoglycemia), especially if you   use insulin or take certain medicines for diabetes mellitus. Hypoglycemia can be a life-threatening condition. Symptoms of hypoglycemia (sleepiness, dizziness, and disorientation) are similar to symptoms of having too much alcohol.  If your health care provider has given you approval to drink alcohol, do so in moderation and use the following guidelines:  Women should not have more than one drink per day, and men should not have more than two drinks per day. One drink is equal to:  12 oz of beer.  5 oz of wine.  1 oz of hard liquor.  Do not drink on an empty stomach.  Keep yourself hydrated. Have water, diet soda, or unsweetened iced tea.  Regular soda, juice, and other mixers might contain a lot of carbohydrates and should be counted. WHAT FOODS ARE NOT RECOMMENDED? As you make food choices, it is important to remember that all foods are not the same. Some foods have fewer nutrients per serving than other foods, even though they might have the same number of calories or carbohydrates. It is difficult to get your body what it needs when you eat foods with fewer nutrients. Examples of foods that you should avoid that are high in calories and carbohydrates but low in nutrients include:  Trans fats (most processed foods list trans fats on the Nutrition Facts label).  Regular soda.  Juice.  Candy.  Sweets, such as cake, pie, doughnuts, and cookies.  Fried foods. WHAT FOODS CAN I EAT? Eat nutrient-rich foods, which will nourish your body and keep you healthy. The food you should eat also will depend on several factors, including:  The calories you need.  The medicines you take.  Your weight.  Your blood glucose level.  Your blood pressure level.  Your cholesterol level. You should eat a variety of foods, including:  Protein.  Lean cuts of meat.  Proteins low  in saturated fats, such as fish, egg whites, and beans. Avoid processed meats.  Fruits and vegetables.  Fruits and vegetables that may help control blood glucose levels, such as apples, mangoes, and yams.  Dairy products.  Choose fat-free or low-fat dairy products, such as milk, yogurt, and cheese.  Grains, bread, pasta, and rice.  Choose whole grain products, such as multigrain bread, whole oats, and brown rice. These foods may help control blood pressure.  Fats.  Foods containing healthful fats, such as nuts, avocado, olive oil, canola oil, and fish. DOES EVERYONE WITH DIABETES MELLITUS HAVE THE SAME MEAL PLAN? Because every person with diabetes mellitus is different, there is not one meal plan that works for everyone. It is very important that you meet with a dietitian who will help you create a meal plan that is just right for you.   This information is not intended to replace advice given to you by your health care provider. Make sure you discuss any questions you have with your health care provider.   Document Released: 10/30/2004 Document Revised: 02/23/2014 Document Reviewed: 12/30/2012 Elsevier Interactive Patient Education 2016 [employer]. Levonorgestrel intrauterine device (IUD) What is this medicine? LEVONORGESTREL IUD (LEE voe nor jes trel) is a contraceptive (birth control) device. The device is placed inside the uterus by a healthcare professional. It is used to prevent pregnancy and can also be used to treat heavy bleeding that occurs during your period. Depending on the device, it can be used for 3 to 5 years. This medicine may be used for other purposes; ask your health care provider or pharmacist if you have questions.  What should I tell my health care provider before I take this medicine? They need to know if you have any of these conditions: -abnormal Pap smear -cancer of the breast, uterus, or cervix -diabetes -endometritis -genital or pelvic infection now  or in the past -have more than one sexual partner or your partner has more than one partner -heart disease -history of an ectopic or tubal pregnancy -immune system problems -IUD in place -liver disease or tumor -problems with blood clots or take blood-thinners -use intravenous drugs -uterus of unusual shape -vaginal bleeding that has not been explained -an unusual or allergic reaction to levonorgestrel, other hormones, silicone, or polyethylene, medicines, foods, dyes, or preservatives -pregnant or trying to get pregnant -breast-feeding How should I use this medicine? This device is placed inside the uterus by a health care professional. Talk to your pediatrician regarding the use of this medicine in children. Special care may be needed. Overdosage: If you think you have taken too much of this medicine contact a poison control center or emergency room at once. NOTE: This medicine is only for you. Do not share this medicine with others. What if I miss a dose? This does not apply. What may interact with this medicine? Do not take this medicine with any of the following medications: -amprenavir -bosentan -fosamprenavir This medicine may also interact with the following medications: -aprepitant -barbiturate medicines for inducing sleep or treating seizures -bexarotene -griseofulvin -medicines to treat seizures like carbamazepine, ethotoin, felbamate, oxcarbazepine, phenytoin, topiramate -modafinil -pioglitazone -rifabutin -rifampin -rifapentine -some medicines to treat HIV infection like atazanavir, indinavir, lopinavir, nelfinavir, tipranavir, ritonavir -St. John's wort -warfarin This list may not describe all possible interactions. Give your health care provider a list of all the medicines, herbs, non-prescription drugs, or dietary supplements you use. Also tell them if you smoke, drink alcohol, or use illegal drugs. Some items may interact with your medicine. What should I  watch for while using this medicine? Visit your doctor or health care professional for regular check ups. See your doctor if you or your partner has sexual contact with others, becomes HIV positive, or gets a sexual transmitted disease. This product does not protect you against HIV infection (AIDS) or other sexually transmitted diseases. You can check the placement of the IUD yourself by reaching up to the top of your vagina with clean fingers to feel the threads. Do not pull on the threads. It is a good habit to check placement after each menstrual period. Call your doctor right away if you feel more of the IUD than just the threads or if you cannot feel the threads at all. The IUD may come out by itself. You may become pregnant if the device comes out. If you notice that the IUD has come out use a backup birth control method like condoms and call your health care provider. Using tampons will not change the position of the IUD and are okay to use during your period. What side effects may I notice from receiving this medicine? Side effects that you should report to your doctor or health care professional as soon as possible: -allergic reactions like skin rash, itching or hives, swelling of the face, lips, or tongue -fever, flu-like symptoms -genital sores -high blood pressure -no menstrual period for 6 weeks during use -pain, swelling, warmth in the leg -pelvic pain or tenderness -severe or sudden headache -signs of pregnancy -stomach cramping -sudden shortness of breath -trouble with balance, talking, or walking -unusual vaginal bleeding, discharge -yellowing of  the eyes or skin Side effects that usually do not require medical attention (report to your doctor or health care professional if they continue or are bothersome): -acne -breast pain -change in sex drive or performance -changes in weight -cramping, dizziness, or faintness while the device is being inserted -headache -irregular  menstrual bleeding within first 3 to 6 months of use -nausea This list may not describe all possible side effects. Call your doctor for medical advice about side effects. You may report side effects to FDA at 1-800-FDA-1088. Where should I keep my medicine? This does not apply. NOTE: This sheet is a summary. It may not cover all possible information. If you have questions about this medicine, talk to your doctor, pharmacist, or health care provider.    2016, Elsevier/[employer] Standard. (2011-03-05 13:54:04)

## 2015-02-14 NOTE — Progress Notes (Signed)
Debra Gibson 12-Feb-1971 [id number]    History:    Presents for annual exam. 04/2010 Mirena IUD amenorrhea. Normal Pap and mammogram history reports normal mammogram at [facility] last year. Primary care manages hypertension, diabetes, anxiety/depression. Has lost 10 pounds in the past year and is desiring to lose more. Same partner.  Past medical history, past surgical history, family history and social history were all reviewed and documented in the EPIC chart. Works for [employer]. Parents diabetes, hypertension and hypercholesterolemia both deceased.  ROS:  A ROS was performed and pertinent positives and negatives are included.  Exam:  Filed Vitals:   02/14/15 1013  BP: 136/80    General appearance:  Normal Thyroid:  Symmetrical, normal in size, without palpable masses or nodularity. Respiratory  Auscultation:  Clear without wheezing or rhonchi Cardiovascular  Auscultation:  Regular rate, without rubs, murmurs or gallops  Edema/varicosities:  Not grossly evident Abdominal  Soft,nontender, without masses, guarding or rebound.  Liver/spleen:  No organomegaly noted  Hernia:  None appreciated  Skin  Inspection:  Grossly normal   Breasts: Examined lying and sitting.     Right: Without masses, retractions, discharge or axillary adenopathy.     Left: Without masses, retractions, discharge or axillary adenopathy. Gentitourinary   Inguinal/mons:  Normal without inguinal adenopathy  External genitalia:  Normal  BUS/Urethra/Skene's glands:  Normal  Vagina:  Normal  Cervix:  Normal IUD  string visible  Uterus: normal in size, shape and contour.  Midline and mobile  Adnexa/parametria:     Rt: Without masses or tenderness.   Lt: Without masses or tenderness.  Anus and perineum: Normal  Digital rectal exam: Normal sphincter tone without palpated masses or tenderness  Assessment/Plan:  44 y.o. S WF G1 P1  for annual exam with no complaints.  04/2010 Mirena IUD  amenorrhea Hypertension/diabetes/anxiety and depression-primary care manages labs and meds Obesity  Plan: Schedule March 2017 Mirena IUD to be removed and replaced with Dr. Audie BoxFontaine. Will check IUD coverage. Other options reviewed, reviewed slight risk for hemorrhage, infection or perforation. SBE's, continue annual screening mammogram instructed to have mammogram results sent to  both primary and our office. Continue to increase regular exercise and decrease calories for continued weight loss. UA, Pap with HR HPV typing, new screening guidelines reviewed.  Harrington ChallengerYOUNG,NANCY J WHNP, 11:08 AM 02/14/2015

## 2015-02-14 NOTE — Telephone Encounter (Signed)
Patient referred by Dr. Thea SilversmithMacKenzie, seen by me on 01/17/15, diagnostic PSG on 02/12/15:    Please call and notify the patient that the recent sleep study showed borderline obstructive sleep apnea and confirmed leg twitching in sleep. Please inform patient that I would like to go over the details of the study during a follow up appointment. Arrange a followup appointment. Also, route or fax report to PCP and referring MD, if other than PCP.  Once you have spoken to patient, you can close this encounter.   Thanks,  Huston FoleySaima Katalaya Beel, MD, PhD [facility]

## 2015-02-15 LAB — URINALYSIS W MICROSCOPIC + REFLEX CULTURE
Bilirubin Urine: NEGATIVE
CASTS: NONE SEEN [LPF]
CRYSTALS: NONE SEEN [HPF]
Ketones, ur: NEGATIVE
NITRITE: NEGATIVE
PH: 5.5 (ref 5.0–8.0)
Specific Gravity, Urine: 1.029 (ref 1.001–1.035)
YEAST: NONE SEEN [HPF]

## 2015-02-15 LAB — CYTOLOGY - PAP

## 2015-02-17 LAB — URINE CULTURE

## 2015-02-19 NOTE — Telephone Encounter (Signed)
I spoke to patient and she is aware of results and made appt for tomorrow.

## 2015-02-20 ENCOUNTER — Ambulatory Visit (INDEPENDENT_AMBULATORY_CARE_PROVIDER_SITE_OTHER): Payer: 59 | Admitting: Neurology

## 2015-02-20 ENCOUNTER — Encounter: Payer: Self-pay | Admitting: Neurology

## 2015-02-20 VITALS — BP 128/96 | HR 100 | Resp 18 | Ht 62.0 in | Wt 221.0 lb

## 2015-02-20 DIAGNOSIS — G4733 Obstructive sleep apnea (adult) (pediatric): Secondary | ICD-10-CM

## 2015-02-20 DIAGNOSIS — G479 Sleep disorder, unspecified: Secondary | ICD-10-CM

## 2015-02-20 DIAGNOSIS — G2581 Restless legs syndrome: Secondary | ICD-10-CM

## 2015-02-20 DIAGNOSIS — G4761 Periodic limb movement disorder: Secondary | ICD-10-CM | POA: Diagnosis not present

## 2015-02-20 MED ORDER — ROPINIROLE HCL 0.5 MG PO TABS: 0.7500 mg | ORAL_TABLET | Freq: Every day | ORAL | Status: DC
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
## 2015-02-20 NOTE — Patient Instructions (Signed)
As discussed, we will try treatment of your sleep apnea with autoPAP at home. We will set you up with a DME company for this. They will help you get a machine for home use.  For your restless legs syndrome, let's keep your ropinirole the same: 0.5 mg, 1 1/2 pills each night, about 2 hours before bedtime.

## 2015-02-21 ENCOUNTER — Other Ambulatory Visit: Payer: Self-pay | Admitting: Women's Health

## 2015-02-21 MED ORDER — SULFAMETHOXAZOLE-TRIMETHOPRIM 800-160 MG PO TABS: 1.0000 | ORAL_TABLET | Freq: Two times a day (BID) | ORAL | Status: DC

## 2015-02-28 ENCOUNTER — Encounter (INDEPENDENT_AMBULATORY_CARE_PROVIDER_SITE_OTHER): Payer: Self-pay | Admitting: Diagnostic Neuroimaging

## 2015-02-28 ENCOUNTER — Ambulatory Visit (INDEPENDENT_AMBULATORY_CARE_PROVIDER_SITE_OTHER): Payer: 59 | Admitting: Diagnostic Neuroimaging

## 2015-02-28 DIAGNOSIS — G4719 Other hypersomnia: Secondary | ICD-10-CM

## 2015-02-28 DIAGNOSIS — R202 Paresthesia of skin: Secondary | ICD-10-CM | POA: Diagnosis not present

## 2015-02-28 DIAGNOSIS — E0842 Diabetes mellitus due to underlying condition with diabetic polyneuropathy: Secondary | ICD-10-CM

## 2015-02-28 DIAGNOSIS — G4761 Periodic limb movement disorder: Secondary | ICD-10-CM

## 2015-02-28 DIAGNOSIS — G2581 Restless legs syndrome: Secondary | ICD-10-CM

## 2015-02-28 DIAGNOSIS — R351 Nocturia: Secondary | ICD-10-CM

## 2015-02-28 DIAGNOSIS — Z0289 Encounter for other administrative examinations: Secondary | ICD-10-CM

## 2015-02-28 DIAGNOSIS — E669 Obesity, unspecified: Secondary | ICD-10-CM

## 2015-03-01 NOTE — Procedures (Signed)
   [facility]  NCS (NERVE CONDUCTION STUDY) WITH EMG (ELECTROMYOGRAPHY) REPORT   STUDY DATE: 02/28/15 PATIENT NAME: Debra DagoRuby M Fairfax DOB: 1970-08-12 MRN: [id number]  ORDERING CLINICIAN: Huston FoleySaima Athar, MD PhD   TECHNOLOGIST: Gearldine ShownLorraine Jones  ELECTROMYOGRAPHER: Glenford BayleyVikram R. Shruthi Northrup, MD  CLINICAL INFORMATION: 45 year old female with burning feet. Patient denies back pain.  FINDINGS: NERVE CONDUCTION STUDY: Bilateral peroneal and tibial motor responses and F wave latencies are normal. Bilateral H reflex responses are normal. Bilateral sural sensory responses are normal.  NEEDLE ELECTROMYOGRAPHY: Needle examination of left lower extremity vastus medialis, tibials anterior, gastrocnemius is normal.  IMPRESSION:  Normal study. No electrodiagnostic evidence of large fiber neuropathy at this time.    INTERPRETING PHYSICIAN:  Suanne MarkerVIKRAM R. Nic Lampe, MD Certified in Neurology, Neurophysiology and Neuroimaging  [facility] [address], [address] [place], [place] [address] [phone]

## 2015-03-04 ENCOUNTER — Telehealth: Payer: Self-pay

## 2015-03-04 NOTE — Telephone Encounter (Signed)
-----   Message from Huston FoleySaima Athar, MD sent at 03/04/2015  3:40 PM EST ----- Please call and advise the patient that the recent EMG and nerve conduction velocity test, which is the electrical nerve and muscle test we we performed, was reported as within normal limits. We checked for abnormal electrical discharges in the muscles or nerves and the report suggested normal findings. No further action is required on this test at this time. Please remind patient to keep any upcoming appointments or tests and to call us with any interim questions, concerns, problems or updates. Thanks,  Huston FoleySaima Athar, MD, PhD

## 2015-03-04 NOTE — Telephone Encounter (Signed)
Left message on vm with results and recommendation below (per DPR). Left call back number for any questions.

## 2015-03-04 NOTE — Progress Notes (Signed)
Quick Note:  Please call and advise the patient that the recent EMG and nerve conduction velocity test, which is the electrical nerve and muscle test we we performed, was reported as within normal limits. We checked for abnormal electrical discharges in the muscles or nerves and the report suggested normal findings. No further action is required on this test at this time. Please remind patient to keep any upcoming appointments or tests and to call us with any interim questions, concerns, problems or updates. Thanks,  Montavis Schubring, MD, PhD   ______ 

## 2015-03-14 ENCOUNTER — Telehealth: Payer: Self-pay

## 2015-03-14 NOTE — Telephone Encounter (Signed)
Auto titration was denied based on 3%sats scored. 4% was scored also. Faxed technical data to [employer] to have them re submit for approval. It has more detailed information.  Thanks

## 2015-03-21 ENCOUNTER — Telehealth: Payer: Self-pay | Admitting: Gynecology

## 2015-03-21 NOTE — Telephone Encounter (Signed)
03/21/15-I LM VM cell phone per DPR that pt [employer] ins will cover the new Mirena for contraception at 100%, no copay. Appt has already been made with TF in March, 2017. Per [email or web address] -Ref#.wl

## 2015-04-01 ENCOUNTER — Telehealth: Payer: Self-pay | Admitting: Neurology

## 2015-04-01 NOTE — Telephone Encounter (Signed)
Debra Gibson with [facility] called to check on status of peer to peer call. Pt was denied CPAP. Please call [phone] ext [phone]. May need to send appeal now.

## 2015-04-01 NOTE — Telephone Encounter (Signed)
I think Zella Ball has worked on this by resubmitting testing showing correct scoring. Do you know the status of this? (Im sending this to [place])

## 2015-05-09 ENCOUNTER — Encounter: Payer: Self-pay | Admitting: Gynecology

## 2015-05-09 ENCOUNTER — Ambulatory Visit (INDEPENDENT_AMBULATORY_CARE_PROVIDER_SITE_OTHER): Payer: 59 | Admitting: Gynecology

## 2015-05-09 VITALS — BP 122/80

## 2015-05-09 DIAGNOSIS — Z30433 Encounter for removal and reinsertion of intrauterine contraceptive device: Secondary | ICD-10-CM | POA: Diagnosis not present

## 2015-05-09 DIAGNOSIS — N882 Stricture and stenosis of cervix uteri: Secondary | ICD-10-CM

## 2015-05-09 HISTORY — PX: INTRAUTERINE DEVICE INSERTION: SHX323

## 2015-05-09 NOTE — Patient Instructions (Signed)
Intrauterine Device Insertion Most often, an intrauterine device (IUD) is inserted into the uterus to prevent pregnancy. There are 2 types of IUDs available:  Copper IUD--This type of IUD creates an environment that is not favorable to sperm survival. The mechanism of action of the copper IUD is not known for certain. It can stay in place for 10 years.  Hormone IUD--This type of IUD contains the hormone progestin (synthetic progesterone). The progestin thickens the cervical mucus and prevents sperm from entering the uterus, and it also thins the uterine lining. There is no evidence that the hormone IUD prevents implantation. One hormone IUD can stay in place for up to 5 years, and a different hormone IUD can stay in place for up to 3 years. An IUD is the most cost-effective birth control if left in place for the full duration. It may be removed at any time. LET YOUR HEALTH CARE PROVIDER KNOW ABOUT:  Any allergies you have.  All medicines you are taking, including vitamins, herbs, eye drops, creams, and over-the-counter medicines.  Previous problems you or members of your family have had with the use of anesthetics.  Any blood disorders you have.  Previous surgeries you have had.  Possibility of pregnancy.  Medical conditions you have. RISKS AND COMPLICATIONS  Generally, intrauterine device insertion is a safe procedure. However, as with any procedure, complications can occur. Possible complications include:  Accidental puncture (perforation) of the uterus.  Accidental placement of the IUD either in the muscle layer of the uterus (myometrium) or outside the uterus. If this happens, the IUD can be found essentially floating around the bowels and must be taken out surgically.  The IUD may fall out of the uterus (expulsion). This is more common in women who have recently had a child.   Pregnancy in the fallopian tube (ectopic).  Pelvic inflammatory disease (PID), which is infection of  the uterus and fallopian tubes. The risk of PID is slightly increased in the first 20 days after the IUD is placed, but the overall risk is still very low. BEFORE THE PROCEDURE  Schedule the IUD insertion for when you will have your menstrual period or right after, to make sure you are not pregnant. Placement of the IUD is better tolerated shortly after a menstrual cycle.  You may need to take tests or be examined to make sure you are not pregnant.  You may be required to take a pregnancy test.  You may be required to get checked for sexually transmitted infections (STIs) prior to placement. Placing an IUD in someone who has an infection can make the infection worse.  You may be given a pain reliever to take 1 or 2 hours before the procedure.  An exam will be performed to determine the size and position of your uterus.  Ask your health care provider about changing or stopping your regular medicines. PROCEDURE   A tool (speculum) is placed in the vagina. This allows your health care provider to see the lower part of the uterus (cervix).  The cervix is prepped with a medicine that lowers the risk of infection.  You may be given a medicine to numb each side of the cervix (intracervical or paracervical block). This is used to block and control any discomfort with insertion.  A tool (uterine sound) is inserted into the uterus to determine the length of the uterine cavity and the direction the uterus may be tilted.  A slim instrument (IUD inserter) is inserted through the cervical   canal and into your uterus.  The IUD is placed in the uterine cavity and the insertion device is removed.  The nylon string that is attached to the IUD and used for eventual IUD removal is trimmed. It is trimmed so that it lays high in the vagina, just outside the cervix. AFTER THE PROCEDURE  You may have bleeding after the procedure. This is normal. It varies from light spotting for a few days to menstrual-like  bleeding.  You may have mild cramping.   This information is not intended to replace advice given to you by your health care provider. Make sure you discuss any questions you have with your health care provider.   Document Released: 10/01/2010 Document Revised: 11/23/2012 Document Reviewed: 07/24/2012 Elsevier Interactive Patient Education 2016 Elsevier Inc.  

## 2015-05-09 NOTE — Progress Notes (Signed)
    Debra BellmanRuby Hughes Gibson Debra Gibson 25-Aug-1970 [id number]        45 y.o.  G2P0011  presents for Mirena IUD removal and replacement. She is at the five-year mark with her current Mirena IUD.  She has read through the booklet, has no contraindications and signed the consent form. She is not having menses.  I reviewed the removal and insertional process with her as well as the risks to include infection, either immediate or long-term, uterine perforation or migration requiring surgery to remove, other complications such as pain, hormonal side effects and possibility of failure with subsequent pregnancy.   Exam with Kennon PortelaKim Gardner assistant Filed Vitals:   05/09/15 0830  BP: 122/80    Pelvic: External BUS vagina normal. Cervix normal, IUD string not visualized. Uterus axial to anteverted normal size shape contour midline mobile nontender. Adnexa without masses or tenderness.  Procedure: The cervical os initially was attempted to be probed with a [place] forcep to retrieve the IUD string within the canal and due to cervical stenosis I was unable to place the [place] within the cervical canal. The cervix was cleansed with Betadine and a paracervical block using 1% Xylocaine 8 cc total was placed. The anterior lip of the cervix was grasped with a tenaculum and the cervix was dilated and subsequently the [facility] forcep was placed within the endocervical canal opened and closed and the IUD string was grasped and the old Mirena IUD was removed, shown to the patient and discarded. The uterus was sounded and a new Mirena IUD was placed according to manufacturer's recommendations without difficulty. The strings were trimmed. The patient tolerated well and will follow up in one month for a postinsertional check.  Lot number:  [id number]    Dara LordsFONTAINE,Yeslin Delio P MD, 8:46 AM 05/09/2015

## 2015-05-10 ENCOUNTER — Encounter: Payer: Self-pay | Admitting: Gynecology

## 2015-05-20 ENCOUNTER — Telehealth: Payer: Self-pay

## 2015-05-20 NOTE — Telephone Encounter (Signed)
Call patient and LM to remind her to bring in memory card for appt tomorrow.

## 2015-05-21 ENCOUNTER — Encounter: Payer: Self-pay | Admitting: Neurology

## 2015-05-21 ENCOUNTER — Ambulatory Visit (INDEPENDENT_AMBULATORY_CARE_PROVIDER_SITE_OTHER): Payer: 59 | Admitting: Neurology

## 2015-05-21 VITALS — BP 122/78 | HR 84 | Resp 16 | Ht 62.0 in | Wt 223.0 lb

## 2015-05-21 DIAGNOSIS — G2581 Restless legs syndrome: Secondary | ICD-10-CM | POA: Diagnosis not present

## 2015-05-21 DIAGNOSIS — E0842 Diabetes mellitus due to underlying condition with diabetic polyneuropathy: Secondary | ICD-10-CM | POA: Diagnosis not present

## 2015-05-21 DIAGNOSIS — E669 Obesity, unspecified: Secondary | ICD-10-CM

## 2015-05-21 DIAGNOSIS — G4761 Periodic limb movement disorder: Secondary | ICD-10-CM | POA: Diagnosis not present

## 2015-05-21 MED ORDER — ROPINIROLE HCL 1 MG PO TABS: 1.0000 mg | ORAL_TABLET | Freq: Every day | ORAL | Status: DC

## 2015-05-21 NOTE — Patient Instructions (Signed)
Let's increase your ropinirole to 1 mg once each night, around 9 PM.  We will check blood work today and call you with the test results.
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
## 2015-05-22 ENCOUNTER — Telehealth: Payer: Self-pay

## 2015-05-22 LAB — CBC WITH DIFFERENTIAL/PLATELET
BASOS ABS: 0 10*3/uL (ref 0.0–0.2)
Basos: 0 %
EOS (ABSOLUTE): 0.3 10*3/uL (ref 0.0–0.4)
Eos: 3 %
HEMOGLOBIN: 13.1 g/dL (ref 11.1–15.9)
Hematocrit: 38.9 % (ref 34.0–46.6)
Immature Grans (Abs): 0 10*3/uL (ref 0.0–0.1)
Immature Granulocytes: 0 %
LYMPHS ABS: 3 10*3/uL (ref 0.7–3.1)
Lymphs: 28 %
MCH: 29.1 pg (ref 26.6–33.0)
MCHC: 33.7 g/dL (ref 31.5–35.7)
MCV: 86 fL (ref 79–97)
MONOCYTES: 6 %
MONOS ABS: 0.6 10*3/uL (ref 0.1–0.9)
NEUTROS PCT: 63 %
Neutrophils Absolute: 6.9 10*3/uL (ref 1.4–7.0)
Platelets: 379 10*3/uL (ref 150–379)
RBC: 4.5 x10E6/uL (ref 3.77–5.28)
RDW: 14.4 % (ref 12.3–15.4)
WBC: 10.9 10*3/uL — AB (ref 3.4–10.8)

## 2015-05-22 LAB — IRON AND TIBC
Iron Saturation: 16 % (ref 15–55)
Iron: 52 ug/dL (ref 27–159)
Total Iron Binding Capacity: 329 ug/dL (ref 250–450)
UIBC: 277 ug/dL (ref 131–425)

## 2015-05-22 LAB — HGB A1C W/O EAG: HEMOGLOBIN A1C: 6.4 % — AB (ref 4.8–5.6)

## 2015-05-22 LAB — FERRITIN: Ferritin: 64 ng/mL (ref 15–150)

## 2015-05-22 LAB — VITAMIN D 25 HYDROXY (VIT D DEFICIENCY, FRACTURES): VIT D 25 HYDROXY: 55.6 ng/mL (ref 30.0–100.0)

## 2015-05-22 NOTE — Telephone Encounter (Signed)
-----   Message from Huston FoleySaima Athar, MD sent at 05/22/2015  1:14 PM EDT ----- Please call Mckenzee regarding her blood test results from yesterday. Labs looked good, iron studies okay, hemoglobin A1c improved from before. No further action required. Huston FoleySaima Athar, MD, PhD [facility]

## 2015-05-22 NOTE — Progress Notes (Signed)
Quick Note:  Please call Debra Gibson regarding her blood test results from yesterday. Labs looked good, iron studies okay, hemoglobin A1c improved from before. No further action required. Huston FoleySaima Bryten Maher, MD, PhD [facility] ([facility]  ______

## 2015-05-22 NOTE — Telephone Encounter (Signed)
LM with results below (per DPR)  

## 2015-06-06 ENCOUNTER — Ambulatory Visit: Payer: 59 | Admitting: Gynecology

## 2015-06-18 ENCOUNTER — Encounter: Payer: Self-pay | Admitting: Gynecology

## 2015-06-18 ENCOUNTER — Ambulatory Visit (INDEPENDENT_AMBULATORY_CARE_PROVIDER_SITE_OTHER): Payer: 59 | Admitting: Gynecology

## 2015-06-18 VITALS — BP 130/84

## 2015-06-18 DIAGNOSIS — Z30431 Encounter for routine checking of intrauterine contraceptive device: Secondary | ICD-10-CM | POA: Diagnosis not present

## 2015-06-18 NOTE — Progress Notes (Signed)
    Debra Gibson Debra Gibson 04-17-1970 [id number]        45 y.o.  G2P0011 Presents for follow up IUD check after having her Mirena IUD replaced 05/09/2015. Has done well without bleeding or pain issues.  Past medical history,surgical history, problem list, medications, allergies, family history and social history were all reviewed and documented in the EPIC chart.  Directed ROS with pertinent positives and negatives documented in the history of present illness/assessment and plan.  Exam: Debra PortelaKim Gibson assistant Filed Vitals:   06/18/15 1412  BP: 130/84   General appearance:  Normal Abdomen soft nontender without masses guarding rebound Pelvic external BUS vagina normal. Cervix normal. IUD string visualized and appropriate length. Uterus normal size midline mobile nontender. Adnexa without masses or tenderness  Assessment/Plan:  45 y.o. G2P0011 with normal follow up IUD check. Patient will follow up in January 2018 when due for annual exam, sooner if any issues.    Debra LordsFONTAINE,Lennex Pietila P MD, 2:48 PM 06/18/2015

## 2015-06-18 NOTE — Patient Instructions (Signed)
Follow up in January 2018 for annual exam. Sooner if any issues.

## 2015-11-20 ENCOUNTER — Ambulatory Visit: Payer: 59 | Admitting: Neurology

## 2015-11-20 ENCOUNTER — Telehealth: Payer: Self-pay

## 2015-11-20 NOTE — Telephone Encounter (Signed)
Patient cancelled appt same day as appt.  

## 2016-01-15 ENCOUNTER — Encounter: Payer: Self-pay | Admitting: Neurology

## 2016-01-15 ENCOUNTER — Ambulatory Visit (INDEPENDENT_AMBULATORY_CARE_PROVIDER_SITE_OTHER): Payer: 59 | Admitting: Neurology

## 2016-01-15 VITALS — BP 130/88 | HR 88 | Resp 16 | Ht 62.0 in | Wt 227.0 lb

## 2016-01-15 DIAGNOSIS — G4761 Periodic limb movement disorder: Secondary | ICD-10-CM

## 2016-01-15 DIAGNOSIS — G2581 Restless legs syndrome: Secondary | ICD-10-CM

## 2016-01-15 MED ORDER — ROPINIROLE HCL 1 MG PO TABS: 1.0000 mg | ORAL_TABLET | Freq: Every day | ORAL | 3 refills | Status: DC

## 2016-01-15 NOTE — Patient Instructions (Signed)
As discussed, we will keep your ropinirole at 1 mg each night.  We can see you in 6 mo, you can see one of our nurse practitioners as you are stable. I will see you after that.
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
## 2016-02-18 ENCOUNTER — Encounter: Payer: 59 | Admitting: Women's Health

## 2016-02-26 ENCOUNTER — Ambulatory Visit (INDEPENDENT_AMBULATORY_CARE_PROVIDER_SITE_OTHER): Payer: 59 | Admitting: Women's Health

## 2016-02-26 ENCOUNTER — Encounter: Payer: Self-pay | Admitting: Women's Health

## 2016-02-26 VITALS — BP 130/80 | Ht 62.0 in | Wt 227.0 lb

## 2016-02-26 DIAGNOSIS — Z01419 Encounter for gynecological examination (general) (routine) without abnormal findings: Secondary | ICD-10-CM

## 2016-02-26 LAB — URINALYSIS W MICROSCOPIC + REFLEX CULTURE
BACTERIA UA: NONE SEEN [HPF]
BILIRUBIN URINE: NEGATIVE
Casts: NONE SEEN [LPF]
GLUCOSE, UA: NEGATIVE
KETONES UR: NEGATIVE
Nitrite: NEGATIVE
PROTEIN: NEGATIVE
Specific Gravity, Urine: 1.026 (ref 1.001–1.035)
Squamous Epithelial / LPF: NONE SEEN [HPF] (ref <=5)
WBC UA: NONE SEEN WBC/HPF (ref <=5)
Yeast: NONE SEEN [HPF]
pH: 5.5 (ref 5.0–8.0)

## 2016-02-26 NOTE — Progress Notes (Signed)
Debra Gibson BetterM Fait November 25, 1970 [id number]    History:    Presents for annual exam.  04/2015 Mirena IUD a minute menorrhea. Same partner years. Normal Pap history overdue for mammogram. Hypertension/diabetes/asthma primary care managing. Vitamin D 55   01/2015. Hemoglobin A1c has decreased over the past year.  Past medical history, past surgical history, family history and social history were all reviewed and documented in the EPIC chart. 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 Parents deceased from diabetes, hypertension hypercholesterolemia. Daughter 24 doing well.  ROS:  A ROS was performed and pertinent positives and negatives are included.  Exam:  Vitals:   02/26/16 0835  BP: 130/80  Weight: 227 lb (103 kg)  Height: 5\' 2"  (1.575 m)   Body mass index is 41.52 kg/m.   General appearance:  Normal Thyroid:  Symmetrical, normal in size, without palpable masses or nodularity. Respiratory  Auscultation:  Clear without wheezing or rhonchi Cardiovascular  Auscultation:  Regular rate, without rubs, murmurs or gallops  Edema/varicosities:  Not grossly evident Abdominal  Soft,nontender, without masses, guarding or rebound.  Liver/spleen:  No organomegaly noted  Hernia:  None appreciated  Skin  Inspection:  Grossly normal   Breasts: Examined lying and sitting.     Right: Without masses, retractions, discharge or axillary adenopathy.     Left: Without masses, retractions, discharge or axillary adenopathy. Gentitourinary   Inguinal/mons:  Normal without inguinal adenopathy  External genitalia:  Normal  BUS/Urethra/Skene's glands:  Normal  Vagina:  Normal  Cervix:  Normal IUD strings visible  Uterus:  normal in size, shape and contour.  Midline and mobile  Adnexa/parametria:     Rt: Without masses or tenderness.   Lt: Without masses or tenderness.  Anus and perineum: Normal  Digital rectal exam: Normal sphincter tone without palpated masses or  tenderness  Assessment/Plan:  46 y.o. S WF G2 P1 for annual exam no complaints.  04/2015 Mirena IUD-amenorrhea Hypertension/asthma/diabetes-primary care manages labs and meds Obesity  Plan: SBE's, overdue for annual screening mammogram instructed to schedule. Long discussion on importance of decreasing calories and increasing exercise for weight loss and general health. Weight Watchers encouraged. UA, Pap normal 2016 with negative HR HPV typing, new screening guidelines reviewed.  Harrington ChallengerYOUNG,NANCY J [facility] SouthlakeWHNP, 9:13 AM 02/26/2016

## 2016-02-26 NOTE — Patient Instructions (Signed)
Health Maintenance, Female Introduction Adopting a healthy lifestyle and getting preventive care can go a long way to promote health and wellness. Talk with your health care provider about what schedule of regular examinations is right for you. This is a good chance for you to check in with your provider about disease prevention and staying healthy. In between checkups, there are plenty of things you can do on your own. Experts have done a lot of research about which lifestyle changes and preventive measures are most likely to keep you healthy. Ask your health care provider for more information. Weight and diet Eat a healthy diet  Be sure to include plenty of vegetables, fruits, low-fat dairy products, and lean protein.  Do not eat a lot of foods high in solid fats, added sugars, or salt.  Get regular exercise. This is one of the most important things you can do for your health.  Most adults should exercise for at least 150 minutes each week. The exercise should increase your heart rate and make you sweat (moderate-intensity exercise).  Most adults should also do strengthening exercises at least twice a week. This is in addition to the moderate-intensity exercise. Maintain a healthy weight  Body mass index (BMI) is a measurement that can be used to identify possible weight problems. It estimates body fat based on height and weight. Your health care provider can help determine your BMI and help you achieve or maintain a healthy weight.  For females 63 years of age and older:  A BMI below 18.5 is considered underweight.  A BMI of 18.5 to 24.9 is normal.  A BMI of 25 to 29.9 is considered overweight.  A BMI of 30 and above is considered obese. Watch levels of cholesterol and blood lipids  You should start having your blood tested for lipids and cholesterol at 44 years of age, then have this test every 5 years.  You may need to have your cholesterol levels checked more often if:  Your  lipid or cholesterol levels are high.  You are older than 44 years of age.  You are at high risk for heart disease. Cancer screening Lung Cancer  Lung cancer screening is recommended for adults 56-22 years old who are at high risk for lung cancer because of a history of smoking.  A yearly low-dose CT scan of the lungs is recommended for people who:  Currently smoke.  Have quit within the past 15 years.  Have at least a 30-pack-year history of smoking. A pack year is smoking an average of one pack of cigarettes a day for 1 year.  Yearly screening should continue until it has been 15 years since you quit.  Yearly screening should stop if you develop a health problem that would prevent you from having lung cancer treatment. Breast Cancer  Practice breast self-awareness. This means understanding how your breasts normally appear and feel.  It also means doing regular breast self-exams. Let your health care provider know about any changes, no matter how small.  If you are in your 20s or 30s, you should have a clinical breast exam (CBE) by a health care provider every 1-3 years as part of a regular health exam.  If you are 35 or older, have a CBE every year. Also consider having a breast X-ray (mammogram) every year.  If you have a family history of breast cancer, talk to your health care provider about genetic screening.  If you are at high risk for breast cancer,  talk to your health care provider about having an MRI and a mammogram every year.  Breast cancer gene (BRCA) assessment is recommended for women who have family members with BRCA-related cancers. BRCA-related cancers include:  Breast.  Ovarian.  Tubal.  Peritoneal cancers.  Results of the assessment will determine the need for genetic counseling and BRCA1 and BRCA2 testing. Cervical Cancer  Your health care provider may recommend that you be screened regularly for cancer of the pelvic organs (ovaries, uterus, and  vagina). This screening involves a pelvic examination, including checking for microscopic changes to the surface of your cervix (Pap test). You may be encouraged to have this screening done every 3 years, beginning at age 21.  For women ages 30-65, health care providers may recommend pelvic exams and Pap testing every 3 years, or they may recommend the Pap and pelvic exam, combined with testing for human papilloma virus (HPV), every 5 years. Some types of HPV increase your risk of cervical cancer. Testing for HPV may also be done on women of any age with unclear Pap test results.  Other health care providers may not recommend any screening for nonpregnant women who are considered low risk for pelvic cancer and who do not have symptoms. Ask your health care provider if a screening pelvic exam is right for you.  If you have had past treatment for cervical cancer or a condition that could lead to cancer, you need Pap tests and screening for cancer for at least 20 years after your treatment. If Pap tests have been discontinued, your risk factors (such as having a new sexual partner) need to be reassessed to determine if screening should resume. Some women have medical problems that increase the chance of getting cervical cancer. In these cases, your health care provider may recommend more frequent screening and Pap tests. Colorectal Cancer  This type of cancer can be detected and often prevented.  Routine colorectal cancer screening usually begins at 44 years of age and continues through 44 years of age.  Your health care provider may recommend screening at an earlier age if you have risk factors for colon cancer.  Your health care provider may also recommend using home test kits to check for hidden blood in the stool.  A small camera at the end of a tube can be used to examine your colon directly (sigmoidoscopy or colonoscopy). This is done to check for the earliest forms of colorectal  cancer.  Routine screening usually begins at age 50.  Direct examination of the colon should be repeated every 5-10 years through 44 years of age. However, you may need to be screened more often if early forms of precancerous polyps or small growths are found. Skin Cancer  Check your skin from head to toe regularly.  Tell your health care provider about any new moles or changes in moles, especially if there is a change in a mole's shape or color.  Also tell your health care provider if you have a mole that is larger than the size of a pencil eraser.  Always use sunscreen. Apply sunscreen liberally and repeatedly throughout the day.  Protect yourself by wearing long sleeves, pants, a wide-brimmed hat, and sunglasses whenever you are outside. Heart disease, diabetes, and high blood pressure  High blood pressure causes heart disease and increases the risk of stroke. High blood pressure is more likely to develop in:  People who have blood pressure in the high end of the normal range (130-139/85-89 mm Hg).    People who are overweight or obese.  People who are African American.  If you are 18-39 years of age, have your blood pressure checked every 3-5 years. If you are 40 years of age or older, have your blood pressure checked every year. You should have your blood pressure measured twice-once when you are at a hospital or clinic, and once when you are not at a hospital or clinic. Record the average of the two measurements. To check your blood pressure when you are not at a hospital or clinic, you can use:  An automated blood pressure machine at a pharmacy.  A home blood pressure monitor.  If you are between 55 years and 79 years old, ask your health care provider if you should take aspirin to prevent strokes.  Have regular diabetes screenings. This involves taking a blood sample to check your fasting blood sugar level.  If you are at a normal weight and have a low risk for diabetes,  have this test once every three years after 45 years of age.  If you are overweight and have a high risk for diabetes, consider being tested at a younger age or more often. Preventing infection Hepatitis B  If you have a higher risk for hepatitis B, you should be screened for this virus. You are considered at high risk for hepatitis B if:  You were born in a country where hepatitis B is common. Ask your health care provider which countries are considered high risk.  Your parents were born in a high-risk country, and you have not been immunized against hepatitis B (hepatitis B vaccine).  You have HIV or AIDS.  You use needles to inject street drugs.  You live with someone who has hepatitis B.  You have had sex with someone who has hepatitis B.  You get hemodialysis treatment.  You take certain medicines for conditions, including cancer, organ transplantation, and autoimmune conditions. Hepatitis C  Blood testing is recommended for:  Everyone born from 1945 through 1965.  Anyone with known risk factors for hepatitis C. Sexually transmitted infections (STIs)  You should be screened for sexually transmitted infections (STIs) including gonorrhea and chlamydia if:  You are sexually active and are younger than 44 years of age.  You are older than 44 years of age and your health care provider tells you that you are at risk for this type of infection.  Your sexual activity has changed since you were last screened and you are at an increased risk for chlamydia or gonorrhea. Ask your health care provider if you are at risk.  If you do not have HIV, but are at risk, it may be recommended that you take a prescription medicine daily to prevent HIV infection. This is called pre-exposure prophylaxis (PrEP). You are considered at risk if:  You are sexually active and do not regularly use condoms or know the HIV status of your partner(s).  You take drugs by injection.  You are sexually  active with a partner who has HIV. Talk with your health care provider about whether you are at high risk of being infected with HIV. If you choose to begin PrEP, you should first be tested for HIV. You should then be tested every 3 months for as long as you are taking PrEP. Pregnancy  If you are premenopausal and you may become pregnant, ask your health care provider about preconception counseling.  If you may become pregnant, take 400 to 800 micrograms (mcg) of folic acid   every day.  If you want to prevent pregnancy, talk to your health care provider about birth control (contraception). Osteoporosis and menopause  Osteoporosis is a disease in which the bones lose minerals and strength with aging. This can result in serious bone fractures. Your risk for osteoporosis can be identified using a bone density scan.  If you are 53 years of age or older, or if you are at risk for osteoporosis and fractures, ask your health care provider if you should be screened.  Ask your health care provider whether you should take a calcium or vitamin D supplement to lower your risk for osteoporosis.  Menopause may have certain physical symptoms and risks.  Hormone replacement therapy may reduce some of these symptoms and risks. Talk to your health care provider about whether hormone replacement therapy is right for you. Follow these instructions at home:  Schedule regular health, dental, and eye exams.  Stay current with your immunizations.  Do not use any tobacco products including cigarettes, chewing tobacco, or electronic cigarettes.  If you are pregnant, do not drink alcohol.  If you are breastfeeding, limit how much and how often you drink alcohol.  Limit alcohol intake to no more than 1 drink per day for nonpregnant women. One drink equals 12 ounces of beer, 5 ounces of wine, or 1 ounces of hard liquor.  Do not use street drugs.  Do not share needles.  Ask your health care provider for  help if you need support or information about quitting drugs.  Tell your health care provider if you often feel depressed.  Tell your health care provider if you have ever been abused or do not feel safe at home. This information is not intended to replace advice given to you by your health care provider. Make sure you discuss any questions you have with your health care provider. Document Released: 08/18/2010 Document Revised: 07/11/2015 Document Reviewed: 11/06/2014  2017 [employer] Diabetes Mellitus and Food It is important for you to manage your blood sugar (glucose) level. Your blood glucose level can be greatly affected by what you eat. Eating healthier foods in the appropriate amounts throughout the day at about the same time each day will help you control your blood glucose level. It can also help slow or prevent worsening of your diabetes mellitus. Healthy eating may even help you improve the level of your blood pressure and reach or maintain a healthy weight. General recommendations for healthful eating and cooking habits include:  Eating meals and snacks regularly. Avoid going long periods of time without eating to lose weight.  Eating a diet that consists mainly of plant-based foods, such as fruits, vegetables, nuts, legumes, and whole grains.  Using low-heat cooking methods, such as baking, instead of high-heat cooking methods, such as deep frying. Work with your dietitian to make sure you understand how to use the Nutrition Facts information on food labels. How can food affect me? Carbohydrates  Carbohydrates affect your blood glucose level more than any other type of food. Your dietitian will help you determine how many carbohydrates to eat at each meal and teach you how to count carbohydrates. Counting carbohydrates is important to keep your blood glucose at a healthy level, especially if you are using insulin or taking certain medicines for diabetes mellitus. Alcohol  Alcohol  can cause sudden decreases in blood glucose (hypoglycemia), especially if you use insulin or take certain medicines for diabetes mellitus. Hypoglycemia can be a life-threatening condition. Symptoms of hypoglycemia (sleepiness, dizziness,  and disorientation) are similar to symptoms of having too much alcohol. If your health care provider has given you approval to drink alcohol, do so in moderation and use the following guidelines:  Women should not have more than one drink per day, and men should not have more than two drinks per day. One drink is equal to:  12 oz of beer.  5 oz of wine.  1 oz of hard liquor.  Do not drink on an empty stomach.  Keep yourself hydrated. Have water, diet soda, or unsweetened iced tea.  Regular soda, juice, and other mixers might contain a lot of carbohydrates and should be counted. What foods are not recommended? As you make food choices, it is important to remember that all foods are not the same. Some foods have fewer nutrients per serving than other foods, even though they might have the same number of calories or carbohydrates. It is difficult to get your body what it needs when you eat foods with fewer nutrients. Examples of foods that you should avoid that are high in calories and carbohydrates but low in nutrients include:  Trans fats (most processed foods list trans fats on the Nutrition Facts label).  Regular soda.  Juice.  Candy.  Sweets, such as cake, pie, doughnuts, and cookies.  Fried foods. What foods can I eat? Eat nutrient-rich foods, which will nourish your body and keep you healthy. The food you should eat also will depend on several factors, including:  The calories you need.  The medicines you take.  Your weight.  Your blood glucose level.  Your blood pressure level.  Your cholesterol level. You should eat a variety of foods, including:  Protein.  Lean cuts of meat.  Proteins low in saturated fats, such as fish, egg  whites, and beans. Avoid processed meats.  Fruits and vegetables.  Fruits and vegetables that may help control blood glucose levels, such as apples, mangoes, and yams.  Dairy products.  Choose fat-free or low-fat dairy products, such as milk, yogurt, and cheese.  Grains, bread, pasta, and rice.  Choose whole grain products, such as multigrain bread, whole oats, and brown rice. These foods may help control blood pressure.  Fats.  Foods containing healthful fats, such as nuts, avocado, olive oil, canola oil, and fish. Does everyone with diabetes mellitus have the same meal plan? Because every person with diabetes mellitus is different, there is not one meal plan that works for everyone. It is very important that you meet with a dietitian who will help you create a meal plan that is just right for you. This information is not intended to replace advice given to you by your health care provider. Make sure you discuss any questions you have with your health care provider. Document Released: 10/30/2004 Document Revised: 07/11/2015 Document Reviewed: 12/30/2012 [employer] Interactive Patient Education  2017 [employer].

## 2016-02-27 LAB — URINE CULTURE

## 2016-03-02 ENCOUNTER — Encounter: Payer: Self-pay | Admitting: Women's Health

## 2016-07-14 ENCOUNTER — Ambulatory Visit (INDEPENDENT_AMBULATORY_CARE_PROVIDER_SITE_OTHER): Payer: 59 | Admitting: Nurse Practitioner

## 2016-07-14 ENCOUNTER — Encounter: Payer: Self-pay | Admitting: Nurse Practitioner

## 2016-07-14 DIAGNOSIS — G2581 Restless legs syndrome: Secondary | ICD-10-CM | POA: Diagnosis not present

## 2016-07-14 DIAGNOSIS — G4761 Periodic limb movement disorder: Secondary | ICD-10-CM | POA: Diagnosis not present
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
## 2016-07-14 NOTE — Patient Instructions (Addendum)
Continue Requip at current dose Continue exercise program and personal trainer Continue weight loss slowly F/U in 6 months Restless Legs Syndrome Restless legs syndrome is a condition that causes uncomfortable feelings or sensations in the legs, especially while sitting or lying down. The sensations usually cause an overwhelming urge to move the legs. The arms can also sometimes be affected. The condition can range from mild to severe. The symptoms often interfere with a person's ability to sleep. What are the causes? The cause of this condition is not known. What increases the risk? This condition is more likely to develop in:  People who are older than age 44.  Pregnant women. In general, restless legs syndrome is more common in women than in men.  People who have a family history of the condition.  People who have certain medical conditions, such as iron deficiency, kidney disease, Parkinson disease, or nerve damage.  People who take certain medicines, such as medicines for high blood pressure, nausea, colds, allergies, depression, and some heart conditions. What are the signs or symptoms? The main symptom of this condition is uncomfortable sensations in the legs. These sensations may be:  Described as pulling, tingling, prickling, throbbing, crawling, or burning.  Worse while you are sitting or lying down.  Worse during periods of rest or inactivity.  Worse at night, often interfering with your sleep.  Accompanied by a very strong urge to move your legs.  Temporarily relieved by movement of your legs. The sensations usually affect both sides of the body. The arms can also be affected, but this is rare. People who have this condition often have tiredness during the day because of their lack of sleep at night. How is this diagnosed? This condition may be diagnosed based on your description of the symptoms. You may also have tests, including blood tests, to check for other  conditions that may lead to your symptoms. In some cases, you may be asked to spend some time in a sleep lab so your sleeping can be monitored. How is this treated? Treatment for this condition is focused on managing the symptoms. Treatment may include:  Self-help and lifestyle changes.  Medicines. Follow these instructions at home:  Take medicines only as directed by your health care provider.  Try these methods to get temporary relief from the uncomfortable sensations:  Massage your legs.  Walk or stretch.  Take a cold or hot bath.  Practice good sleep habits. For example, go to bed and get up at the same time every day.  Exercise regularly.  Practice ways of relaxing, such as yoga or meditation.  Avoid caffeine and alcohol.  Do not use any tobacco products, including cigarettes, chewing tobacco, or electronic cigarettes. If you need help quitting, ask your health care provider.  Keep all follow-up visits as directed by your health care provider. This is important. Contact a health care provider if: Your symptoms do not improve with treatment, or they get worse. This information is not intended to replace advice given to you by your health care provider. Make sure you discuss any questions you have with your health care provider. Document Released: 01/23/2002 Document Revised: 07/11/2015 Document Reviewed: 01/29/2014 [employer] Interactive Patient Education  2017 [employer]

## 2017-01-04 ENCOUNTER — Other Ambulatory Visit: Payer: Self-pay | Admitting: Neurology

## 2017-01-04 DIAGNOSIS — G4761 Periodic limb movement disorder: Secondary | ICD-10-CM

## 2017-01-04 DIAGNOSIS — G2581 Restless legs syndrome: Secondary | ICD-10-CM

## 2017-01-14 ENCOUNTER — Ambulatory Visit: Payer: 59 | Admitting: Nurse Practitioner

## 2017-02-26 ENCOUNTER — Encounter: Payer: 59 | Admitting: Women's Health

## 2017-03-10 ENCOUNTER — Encounter: Payer: Self-pay | Admitting: Women's Health

## 2017-03-10 ENCOUNTER — Ambulatory Visit (INDEPENDENT_AMBULATORY_CARE_PROVIDER_SITE_OTHER): Payer: 59 | Admitting: Women's Health

## 2017-03-10 VITALS — BP 110/80 | Ht 62.0 in | Wt 230.0 lb

## 2017-03-10 DIAGNOSIS — E0800 Diabetes mellitus due to underlying condition with hyperosmolarity without nonketotic hyperglycemic-hyperosmolar coma (NKHHC): Secondary | ICD-10-CM

## 2017-03-10 DIAGNOSIS — Z01419 Encounter for gynecological examination (general) (routine) without abnormal findings: Secondary | ICD-10-CM

## 2017-03-10 NOTE — Patient Instructions (Signed)
Carbohydrate Counting for Diabetes Mellitus, Adult Carbohydrate counting is a method for keeping track of how many carbohydrates you eat. Eating carbohydrates naturally increases the amount of sugar (glucose) in the blood. Counting how many carbohydrates you eat helps keep your blood glucose within normal limits, which helps you manage your diabetes (diabetes mellitus). It is important to know how many carbohydrates you can safely have in each meal. This is different for every person. A diet and nutrition specialist (registered dietitian) can help you make a meal plan and calculate how many carbohydrates you should have at each meal and snack. Carbohydrates are found in the following foods:  Grains, such as breads and cereals.  Dried beans and soy products.  Starchy vegetables, such as potatoes, peas, and corn.  Fruit and fruit juices.  Milk and yogurt.  Sweets and snack foods, such as cake, cookies, candy, chips, and soft drinks.  How do I count carbohydrates? There are two ways to count carbohydrates in food. You can use either of the methods or a combination of both. Reading "Nutrition Facts" on packaged food The "Nutrition Facts" list is included on the labels of almost all packaged foods and beverages in the [place] It includes:  The serving size.  Information about nutrients in each serving, including the grams (g) of carbohydrate per serving.  To use the "Nutrition Facts":  Decide how many servings you will have.  Multiply the number of servings by the number of carbohydrates per serving.  The resulting number is the total amount of carbohydrates that you will be having.  Learning standard serving sizes of other foods When you eat foods containing carbohydrates that are not packaged or do not include "Nutrition Facts" on the label, you need to measure the servings in order to count the amount of carbohydrates:  Measure the foods that you will eat with a food scale or  measuring cup, if needed.  Decide how many standard-size servings you will eat.  Multiply the number of servings by 15. Most carbohydrate-rich foods have about 15 g of carbohydrates per serving. ? For example, if you eat 8 oz (170 g) of strawberries, you will have eaten 2 servings and 30 g of carbohydrates (2 servings x 15 g = 30 g).  For foods that have more than one food mixed, such as soups and casseroles, you must count the carbohydrates in each food that is included.  The following list contains standard serving sizes of common carbohydrate-rich foods. Each of these servings has about 15 g of carbohydrates:   hamburger bun or  English muffin.   oz (15 mL) syrup.   oz (14 g) jelly.  1 slice of bread.  1 six-inch tortilla.  3 oz (85 g) cooked rice or pasta.  4 oz (113 g) cooked dried beans.  4 oz (113 g) starchy vegetable, such as peas, corn, or potatoes.  4 oz (113 g) hot cereal.  4 oz (113 g) mashed potatoes or  of a large baked potato.  4 oz (113 g) canned or frozen fruit.  4 oz (120 mL) fruit juice.  4-6 crackers.  6 chicken nuggets.  6 oz (170 g) unsweetened dry cereal.  6 oz (170 g) plain fat-free yogurt or yogurt sweetened with artificial sweeteners.  8 oz (240 mL) milk.  8 oz (170 g) fresh fruit or one small piece of fruit.  24 oz (680 g) popped popcorn.  Example of carbohydrate counting Sample meal  3 oz (85 g) chicken breast.    6 oz (170 g) brown rice.  4 oz (113 g) corn.  8 oz (240 mL) milk.  8 oz (170 g) strawberries with sugar-free whipped topping. Carbohydrate calculation 1. Identify the foods that contain carbohydrates: ? Rice. ? Corn. ? Milk. ? Strawberries. 2. Calculate how many servings you have of each food: ? 2 servings rice. ? 1 serving corn. ? 1 serving milk. ? 1 serving strawberries. 3. Multiply each number of servings by 15 g: ? 2 servings rice x 15 g = 30 g. ? 1 serving corn x 15 g = 15 g. ? 1 serving milk x 15  g = 15 g. ? 1 serving strawberries x 15 g = 15 g. 4. Add together all of the amounts to find the total grams of carbohydrates eaten: ? 30 g + 15 g + 15 g + 15 g = 75 g of carbohydrates total. This information is not intended to replace advice given to you by your health care provider. Make sure you discuss any questions you have with your health care provider. Document Released: 02/02/2005 Document Revised: 08/23/2015 Document Reviewed: 07/17/2015 Elsevier Interactive Patient Education  2018 [place] Maintenance, Female Adopting a healthy lifestyle and getting preventive care can go a long way to promote health and wellness. Talk with your health care provider about what schedule of regular examinations is right for you. This is a good chance for you to check in with your provider about disease prevention and staying healthy. In between checkups, there are plenty of things you can do on your own. Experts have done a lot of research about which lifestyle changes and preventive measures are most likely to keep you healthy. Ask your health care provider for more information. Weight and diet Eat a healthy diet  Be sure to include plenty of vegetables, fruits, low-fat dairy products, and lean protein.  Do not eat a lot of foods high in solid fats, added sugars, or salt.  Get regular exercise. This is one of the most important things you can do for your health. ? Most adults should exercise for at least 150 minutes each week. The exercise should increase your heart rate and make you sweat (moderate-intensity exercise). ? Most adults should also do strengthening exercises at least twice a week. This is in addition to the moderate-intensity exercise.  Maintain a healthy weight  Body mass index (BMI) is a measurement that can be used to identify possible weight problems. It estimates body fat based on height and weight. Your health care provider can help determine your BMI and help you  achieve or maintain a healthy weight.  For females 45 years of age and older: ? A BMI below 18.5 is considered underweight. ? A BMI of 18.5 to 24.9 is normal. ? A BMI of 25 to 29.9 is considered overweight. ? A BMI of 30 and above is considered obese.  Watch levels of cholesterol and blood lipids  You should start having your blood tested for lipids and cholesterol at 45 years of age, then have this test every 5 years.  You may need to have your cholesterol levels checked more often if: ? Your lipid or cholesterol levels are high. ? You are older than 45 years of age. ? You are at high risk for heart disease.  Cancer screening Lung Cancer  Lung cancer screening is recommended for adults 52-25 years old who are at high risk for lung cancer because of a history of smoking.  A  yearly low-dose CT scan of the lungs is recommended for people who: ? Currently smoke. ? Have quit within the past 15 years. ? Have at least a 30-pack-year history of smoking. A pack year is smoking an average of one pack of cigarettes a day for 1 year.  Yearly screening should continue until it has been 15 years since you quit.  Yearly screening should stop if you develop a health problem that would prevent you from having lung cancer treatment.  Breast Cancer  Practice breast self-awareness. This means understanding how your breasts normally appear and feel.  It also means doing regular breast self-exams. Let your health care provider know about any changes, no matter how small.  If you are in your 20s or 30s, you should have a clinical breast exam (CBE) by a health care provider every 1-3 years as part of a regular health exam.  If you are 4 or older, have a CBE every year. Also consider having a breast X-ray (mammogram) every year.  If you have a family history of breast cancer, talk to your health care provider about genetic screening.  If you are at high risk for breast cancer, talk to your health  care provider about having an MRI and a mammogram every year.  Breast cancer gene (BRCA) assessment is recommended for women who have family members with BRCA-related cancers. BRCA-related cancers include: ? Breast. ? Ovarian. ? Tubal. ? Peritoneal cancers.  Results of the assessment will determine the need for genetic counseling and BRCA1 and BRCA2 testing.  Cervical Cancer Your health care provider may recommend that you be screened regularly for cancer of the pelvic organs (ovaries, uterus, and vagina). This screening involves a pelvic examination, including checking for microscopic changes to the surface of your cervix (Pap test). You may be encouraged to have this screening done every 3 years, beginning at age 12.  For women ages 13-65, health care providers may recommend pelvic exams and Pap testing every 3 years, or they may recommend the Pap and pelvic exam, combined with testing for human papilloma virus (HPV), every 5 years. Some types of HPV increase your risk of cervical cancer. Testing for HPV may also be done on women of any age with unclear Pap test results.  Other health care providers may not recommend any screening for nonpregnant women who are considered low risk for pelvic cancer and who do not have symptoms. Ask your health care provider if a screening pelvic exam is right for you.  If you have had past treatment for cervical cancer or a condition that could lead to cancer, you need Pap tests and screening for cancer for at least 20 years after your treatment. If Pap tests have been discontinued, your risk factors (such as having a new sexual partner) need to be reassessed to determine if screening should resume. Some women have medical problems that increase the chance of getting cervical cancer. In these cases, your health care provider may recommend more frequent screening and Pap tests.  Colorectal Cancer  This type of cancer can be detected and often  prevented.  Routine colorectal cancer screening usually begins at 45 years of age and continues through 45 years of age.  Your health care provider may recommend screening at an earlier age if you have risk factors for colon cancer.  Your health care provider may also recommend using home test kits to check for hidden blood in the stool.  A small camera at the end of  a tube can be used to examine your colon directly (sigmoidoscopy or colonoscopy). This is done to check for the earliest forms of colorectal cancer.  Routine screening usually begins at age 55.  Direct examination of the colon should be repeated every 5-10 years through 45 years of age. However, you may need to be screened more often if early forms of precancerous polyps or small growths are found.  Skin Cancer  Check your skin from head to toe regularly.  Tell your health care provider about any new moles or changes in moles, especially if there is a change in a mole's shape or color.  Also tell your health care provider if you have a mole that is larger than the size of a pencil eraser.  Always use sunscreen. Apply sunscreen liberally and repeatedly throughout the day.  Protect yourself by wearing long sleeves, pants, a wide-brimmed hat, and sunglasses whenever you are outside.  Heart disease, diabetes, and high blood pressure  High blood pressure causes heart disease and increases the risk of stroke. High blood pressure is more likely to develop in: ? People who have blood pressure in the high end of the normal range (130-139/85-89 mm Hg). ? People who are overweight or obese. ? People who are African American.  If you are 86-18 years of age, have your blood pressure checked every 3-5 years. If you are 64 years of age or older, have your blood pressure checked every year. You should have your blood pressure measured twice-once when you are at a hospital or clinic, and once when you are not at a hospital or clinic.  Record the average of the two measurements. To check your blood pressure when you are not at a hospital or clinic, you can use: ? An automated blood pressure machine at a pharmacy. ? A home blood pressure monitor.  If you are between 78 years and 53 years old, ask your health care provider if you should take aspirin to prevent strokes.  Have regular diabetes screenings. This involves taking a blood sample to check your fasting blood sugar level. ? If you are at a normal weight and have a low risk for diabetes, have this test once every three years after 45 years of age. ? If you are overweight and have a high risk for diabetes, consider being tested at a younger age or more often. Preventing infection Hepatitis B  If you have a higher risk for hepatitis B, you should be screened for this virus. You are considered at high risk for hepatitis B if: ? You were born in a country where hepatitis B is common. Ask your health care provider which countries are considered high risk. ? Your parents were born in a high-risk country, and you have not been immunized against hepatitis B (hepatitis B vaccine). ? You have HIV or AIDS. ? You use needles to inject street drugs. ? You live with someone who has hepatitis B. ? You have had sex with someone who has hepatitis B. ? You get hemodialysis treatment. ? You take certain medicines for conditions, including cancer, organ transplantation, and autoimmune conditions.  Hepatitis C  Blood testing is recommended for: ? Everyone born from [birth year] through 1963. ? Anyone with known risk factors for hepatitis C.  Sexually transmitted infections (STIs)  You should be screened for sexually transmitted infections (STIs) including gonorrhea and chlamydia if: ? You are sexually active and are younger than 44 years of age. ? You are older than  45 years of age and your health care provider tells you that you are at risk for this type of infection. ? Your sexual  activity has changed since you were last screened and you are at an increased risk for chlamydia or gonorrhea. Ask your health care provider if you are at risk.  If you do not have HIV, but are at risk, it may be recommended that you take a prescription medicine daily to prevent HIV infection. This is called pre-exposure prophylaxis (PrEP). You are considered at risk if: ? You are sexually active and do not regularly use condoms or know the HIV status of your partner(s). ? You take drugs by injection. ? You are sexually active with a partner who has HIV.  Talk with your health care provider about whether you are at high risk of being infected with HIV. If you choose to begin PrEP, you should first be tested for HIV. You should then be tested every 3 months for as long as you are taking PrEP. Pregnancy  If you are premenopausal and you may become pregnant, ask your health care provider about preconception counseling.  If you may become pregnant, take 400 to 800 micrograms (mcg) of folic acid every day.  If you want to prevent pregnancy, talk to your health care provider about birth control (contraception). Osteoporosis and menopause  Osteoporosis is a disease in which the bones lose minerals and strength with aging. This can result in serious bone fractures. Your risk for osteoporosis can be identified using a bone density scan.  If you are 29 years of age or older, or if you are at risk for osteoporosis and fractures, ask your health care provider if you should be screened.  Ask your health care provider whether you should take a calcium or vitamin D supplement to lower your risk for osteoporosis.  Menopause may have certain physical symptoms and risks.  Hormone replacement therapy may reduce some of these symptoms and risks. Talk to your health care provider about whether hormone replacement therapy is right for you. Follow these instructions at home:  Schedule regular health, dental,  and eye exams.  Stay current with your immunizations.  Do not use any tobacco products including cigarettes, chewing tobacco, or electronic cigarettes.  If you are pregnant, do not drink alcohol.  If you are breastfeeding, limit how much and how often you drink alcohol.  Limit alcohol intake to no more than 1 drink per day for nonpregnant women. One drink equals 12 ounces of beer, 5 ounces of wine, or 1 ounces of hard liquor.  Do not use street drugs.  Do not share needles.  Ask your health care provider for help if you need support or information about quitting drugs.  Tell your health care provider if you often feel depressed.  Tell your health care provider if you have ever been abused or do not feel safe at home. This information is not intended to replace advice given to you by your health care provider. Make sure you discuss any questions you have with your health care provider. Document Released: 08/18/2010 Document Revised: 07/11/2015 Document Reviewed: 11/06/2014 Elsevier Interactive Patient Education  [employer].

## 2017-03-10 NOTE — Progress Notes (Signed)
Mora BellmanRuby Hughes BetterM Oka [birth year] [id number]    History: 47yo SWF G2P1 presents for annual exam. 04/2015 Mirena IUD ammenorheic. Not sexually active in past year. Normal Pap and mammogram history. HTN/Diabetes/astha managed by primary care.   Past medical history, past surgical history, family history and social history were all reviewed and documented in the EPIC chart. One daughter 1125 who is suffering with mental health issues and agoraphobia that add to her stress. Works from home for [employer], looking into balance ball chair. Parents deceased from diabetes, HTN, hypercholesterolemia.   ROS:  A ROS was performed and pertinent positives and negatives are included.  Exam: Appears well.   Vitals:   03/10/17 0917  BP: 110/80  Weight: 230 lb (104.3 kg)  Height: 5\' 2"  (1.575 m)   Body mass index is 42.07 kg/m.   General appearance:  Normal Thyroid:  Symmetrical, normal in size, without palpable masses or nodularity. Respiratory  Auscultation:  Clear without wheezing or rhonchi Cardiovascular  Auscultation:  Regular rate, without rubs, murmurs or gallops  Edema/varicosities:  Not grossly evident Abdominal  Soft,nontender, without masses, guarding or rebound.  Liver/spleen:  No organomegaly noted  Hernia:  None appreciated  Skin  Inspection:  Grossly normal   Breasts: Examined lying and sitting.     Right: Without masses, retractions, discharge or axillary adenopathy.     Left: Without masses, retractions, discharge or axillary adenopathy. Gentitourinary   Inguinal/mons:  Normal without inguinal adenopathy  External genitalia:  Normal  BUS/Urethra/Skene's glands:  Normal  Vagina:  Normal  Cervix:  Normal, IUD strings visualized.  Uterus:  normal in size, shape and contour.  Midline and mobile  Adnexa/parametria:     Rt: Without masses or tenderness.   Lt: Without masses or tenderness.  Anus and perineum: Normal  Digital rectal exam: Normal sphincter tone without palpated  masses or tenderness  Assessment/Plan:  47 y.o. SWF G2P1  for annual exam  04/2015 Mirena IUD ammenorhea Hypertension/diabtes/asthma/anxiety/depression - PCP manages labs and meds Morbid obesity  Plan: SBE's, patient instructed to schedule annual screening mammogram.  Discussed importance of cooking at home, decreasing calories/carbs, and excercising. Encouraged to increase daily exercise 30 minutes daily walk.  Vitamin D 1000 daily encouraged.  Pap normal 2016 with negative HR HPV typing, new screening guidelines reviewed. A1C performed per patient request.     Harrington Challengerancy J Tanor Glaspy [address] [facility] CenterWHNP, 10:06 AM 03/10/2017

## 2017-03-11 LAB — URINALYSIS W MICROSCOPIC + REFLEX CULTURE
Bilirubin Urine: NEGATIVE
Hyaline Cast: NONE SEEN /LPF
Nitrites, Initial: NEGATIVE
PH: 5.5 (ref 5.0–8.0)
PROTEIN: NEGATIVE
RBC / HPF: NONE SEEN /HPF (ref 0–2)
Specific Gravity, Urine: 1.031 (ref 1.001–1.03)

## 2017-03-11 LAB — HEMOGLOBIN A1C
HEMOGLOBIN A1C: 8 %{Hb} — AB (ref <5.7)
MEAN PLASMA GLUCOSE: 183 (calc)
eAG (mmol/L): 10.1 (calc)

## 2017-06-10 ENCOUNTER — Ambulatory Visit: Payer: 59 | Admitting: Internal Medicine

## 2017-11-11 ENCOUNTER — Other Ambulatory Visit: Payer: Self-pay | Admitting: Neurology

## 2017-11-11 DIAGNOSIS — G2581 Restless legs syndrome: Secondary | ICD-10-CM

## 2017-11-11 DIAGNOSIS — G4761 Periodic limb movement disorder: Secondary | ICD-10-CM

## 2017-12-01 ENCOUNTER — Other Ambulatory Visit: Payer: Self-pay

## 2017-12-01 ENCOUNTER — Telehealth: Payer: Self-pay

## 2017-12-01 NOTE — Telephone Encounter (Signed)
Received a fax from [facility] requesting a refill for Requip. Request was denied due to the patient not having a follow up appointment. Fax has been sent back to [facility]. Confirmation fax has been received.

## 2018-01-06 ENCOUNTER — Other Ambulatory Visit: Payer: Self-pay | Admitting: Nurse Practitioner

## 2018-01-06 DIAGNOSIS — G2581 Restless legs syndrome: Secondary | ICD-10-CM

## 2018-01-06 DIAGNOSIS — G4761 Periodic limb movement disorder: Secondary | ICD-10-CM

## 2018-03-14 ENCOUNTER — Encounter: Payer: 59 | Admitting: Women's Health

## 2018-11-08 ENCOUNTER — Encounter: Payer: Self-pay | Admitting: Gynecology

## 2018-12-09 ENCOUNTER — Other Ambulatory Visit: Payer: Self-pay

## 2018-12-12 ENCOUNTER — Ambulatory Visit (INDEPENDENT_AMBULATORY_CARE_PROVIDER_SITE_OTHER): Payer: 59 | Admitting: Women's Health

## 2018-12-12 ENCOUNTER — Encounter: Payer: Self-pay | Admitting: Women's Health

## 2018-12-12 ENCOUNTER — Other Ambulatory Visit: Payer: Self-pay

## 2018-12-12 VITALS — BP 118/80 | Ht 62.0 in | Wt 202.0 lb

## 2018-12-12 DIAGNOSIS — Z01419 Encounter for gynecological examination (general) (routine) without abnormal findings: Secondary | ICD-10-CM | POA: Diagnosis not present

## 2018-12-12 DIAGNOSIS — Z113 Encounter for screening for infections with a predominantly sexual mode of transmission: Secondary | ICD-10-CM

## 2018-12-12 NOTE — Addendum Note (Signed)
Addended by: Lorine Bears on: 12/12/2018 12:02 PM   Modules accepted: Orders

## 2018-12-12 NOTE — Progress Notes (Signed)
Debra Gibson Debra Gibson Jun 18, 1970 [id number]    History:    Presents for annual exam.  04/2015 Mirena IUD amenorrhea.  Normal Pap and mammogram history.  Same partner, questionable fidelity.  Not sexually active greater than 8 months.  Having no discharge, abdominal pain or urinary symptoms.  Medical problems include hypertension, asthma, GERD, diabetes and anxiety and depression.  Doing better with diabetes has a Dex dron.  Has had 28 pound weight loss in the past year.  Complaints of fatigue/sleepiness during the day.  Past medical history, past surgical history, family history and social history were all reviewed and documented in the EPIC chart.  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  55 year old niece is also living with her full-time.  ROS:  A ROS was performed and pertinent positives and negatives are included.  Exam:  Vitals:   12/12/18 1052  BP: 118/80  Weight: 202 lb (91.6 kg)  Height: 5\' 2"  (1.575 m)   Body mass index is 36.95 kg/m.   General appearance:  Normal Thyroid:  Symmetrical, normal in size, without palpable masses or nodularity. Respiratory  Auscultation:  Clear without wheezing or rhonchi Cardiovascular  Auscultation:  Regular rate, without rubs, murmurs or gallops  Edema/varicosities:  Not grossly evident Abdominal  Soft,nontender, without masses, guarding or rebound.  Liver/spleen:  No organomegaly noted  Hernia:  None appreciated  Skin  Inspection:  Grossly normal   Breasts: Examined lying and sitting.     Right: Without masses, retractions, discharge or axillary adenopathy.     Left: Without masses, retractions, discharge or axillary adenopathy. Gentitourinary   Inguinal/mons:  Normal without inguinal adenopathy  External genitalia:  Normal  BUS/Urethra/Skene's glands:  Normal  Vagina:  Normal  Cervix:  Normal IUD strings visible  Uterus:  normal in size, shape and contour.  Midline and mobile  Adnexa/parametria:     Rt: Without masses or  tenderness.   Lt: Without masses or tenderness.  Anus and perineum: Normal  Digital rectal exam: Normal sphincter tone without palpated masses or tenderness  Assessment/Plan:  48 y.o. S WF G2 P1 for annual exam with no GYN complaints.  04/2015 Mirena IUD amenorrhea Fatigue/sleepiness Primary care manages hypertension, asthma, GERD, diabetes, anxiety/depression, restless leg- labs and meds Obesity  Plan: Congratulated on 28 pound weight loss with diet and exercise.  Encouraged to continue increasing regular cardio type exercise daily, encouraged to walk twice daily 15-minute timeframes, reviewed possibly decreasing Zoloft which may be causing some of her daytime sleepiness.  Reviewed importance of self-care, leisure activities.  SBEs, continue annual screening mammogram has [facility]  Reports as normal.  Calcium rich foods, vitamin D 1000 daily encouraged.  Pap with HR HPV typing, GC/chlamydia, denies need for HIV, or RPR.    [place], 11:26 AM 12/12/2018

## 2018-12-12 NOTE — Patient Instructions (Signed)
Health Maintenance, Female Adopting a healthy lifestyle and getting preventive care are important in promoting health and wellness. Ask your health care provider about:  The right schedule for you to have regular tests and exams.  Things you can do on your own to prevent diseases and keep yourself healthy. What should I know about diet, weight, and exercise? Eat a healthy diet   Eat a diet that includes plenty of vegetables, fruits, low-fat dairy products, and lean protein.  Do not eat a lot of foods that are high in solid fats, added sugars, or sodium. Maintain a healthy weight Body mass index (BMI) is used to identify weight problems. It estimates body fat based on height and weight. Your health care provider can help determine your BMI and help you achieve or maintain a healthy weight. Get regular exercise Get regular exercise. This is one of the most important things you can do for your health. Most adults should:  Exercise for at least 150 minutes each week. The exercise should increase your heart rate and make you sweat (moderate-intensity exercise).  Do strengthening exercises at least twice a week. This is in addition to the moderate-intensity exercise.  Spend less time sitting. Even light physical activity can be beneficial. Watch cholesterol and blood lipids Have your blood tested for lipids and cholesterol at 46 years of age, then have this test every 5 years. Have your cholesterol levels checked more often if:  Your lipid or cholesterol levels are high.  You are older than 46 years of age.  You are at high risk for heart disease. What should I know about cancer screening? Depending on your health history and family history, you may need to have cancer screening at various ages. This may include screening for:  Breast cancer.  Cervical cancer.  Colorectal cancer.  Skin cancer.  Lung cancer. What should I know about heart disease, diabetes, and high blood  pressure? Blood pressure and heart disease  High blood pressure causes heart disease and increases the risk of stroke. This is more likely to develop in people who have high blood pressure readings, are of African descent, or are overweight.  Have your blood pressure checked: ? Every 3-5 years if you are 18-39 years of age. ? Every year if you are 40 years old or older. Diabetes Have regular diabetes screenings. This checks your fasting blood sugar level. Have the screening done:  Once every three years after age 40 if you are at a normal weight and have a low risk for diabetes.  More often and at a younger age if you are overweight or have a high risk for diabetes. What should I know about preventing infection? Hepatitis B If you have a higher risk for hepatitis B, you should be screened for this virus. Talk with your health care provider to find out if you are at risk for hepatitis B infection. Hepatitis C Testing is recommended for:  Everyone born from 1945 through 1965.  Anyone with known risk factors for hepatitis C. Sexually transmitted infections (STIs)  Get screened for STIs, including gonorrhea and chlamydia, if: ? You are sexually active and are younger than 46 years of age. ? You are older than 46 years of age and your health care provider tells you that you are at risk for this type of infection. ? Your sexual activity has changed since you were last screened, and you are at increased risk for chlamydia or gonorrhea. Ask your health care provider if   you are at risk.  Ask your health care provider about whether you are at high risk for HIV. Your health care provider may recommend a prescription medicine to help prevent HIV infection. If you choose to take medicine to prevent HIV, you should first get tested for HIV. You should then be tested every 3 months for as long as you are taking the medicine. Pregnancy  If you are about to stop having your period (premenopausal) and  you may become pregnant, seek counseling before you get pregnant.  Take 400 to 800 micrograms (mcg) of folic acid every day if you become pregnant.  Ask for birth control (contraception) if you want to prevent pregnancy. Osteoporosis and menopause Osteoporosis is a disease in which the bones lose minerals and strength with aging. This can result in bone fractures. If you are 65 years old or older, or if you are at risk for osteoporosis and fractures, ask your health care provider if you should:  Be screened for bone loss.  Take a calcium or vitamin D supplement to lower your risk of fractures.  Be given hormone replacement therapy (HRT) to treat symptoms of menopause. Follow these instructions at home: Lifestyle  Do not use any products that contain nicotine or tobacco, such as cigarettes, e-cigarettes, and chewing tobacco. If you need help quitting, ask your health care provider.  Do not use street drugs.  Do not share needles.  Ask your health care provider for help if you need support or information about quitting drugs. Alcohol use  Do not drink alcohol if: ? Your health care provider tells you not to drink. ? You are pregnant, may be pregnant, or are planning to become pregnant.  If you drink alcohol: ? Limit how much you use to 0-1 drink a day. ? Limit intake if you are breastfeeding.  Be aware of how much alcohol is in your drink. In the [place] one drink equals one 12 oz bottle of beer (355 mL), one 5 oz glass of wine (148 mL), or one 1 oz glass of hard liquor (44 mL). General instructions  Schedule regular health, dental, and eye exams.  Stay current with your vaccines.  Tell your health care provider if: ? You often feel depressed. ? You have ever been abused or do not feel safe at home. Summary  Adopting a healthy lifestyle and getting preventive care are important in promoting health and wellness.  Follow your health care provider's instructions about healthy  diet, exercising, and getting tested or screened for diseases.  Follow your health care provider's instructions on monitoring your cholesterol and blood pressure. This information is not intended to replace advice given to you by your health care provider. Make sure you discuss any questions you have with your health care provider. Document Released: 08/18/2010 Document Revised: 01/26/2018 Document Reviewed: 01/26/2018 Elsevier Patient Education  2020 [employer]  

## 2018-12-13 LAB — PAP, TP IMAGING W/ HPV RNA, RFLX HPV TYPE 16,18/45: HPV DNA High Risk: NOT DETECTED

## 2018-12-13 LAB — C. TRACHOMATIS/N. GONORRHOEAE RNA
C. trachomatis RNA, TMA: NOT DETECTED
N. gonorrhoeae RNA, TMA: NOT DETECTED

## 2018-12-14 LAB — URINALYSIS, COMPLETE W/RFL CULTURE
Bacteria, UA: NONE SEEN /HPF
Bilirubin Urine: NEGATIVE
Hyaline Cast: NONE SEEN /LPF
Leukocyte Esterase: NEGATIVE
Nitrites, Initial: NEGATIVE
Protein, ur: NEGATIVE
Specific Gravity, Urine: 1.044 — ABNORMAL HIGH (ref 1.001–1.03)
pH: <=5 (ref 5.0–8.0)

## 2018-12-14 LAB — URINE CULTURE
MICRO NUMBER:: 1032001
SPECIMEN QUALITY:: ADEQUATE

## 2018-12-14 LAB — CULTURE INDICATED

## 2019-05-08 ENCOUNTER — Ambulatory Visit: Payer: 59 | Attending: Internal Medicine

## 2019-05-08 DIAGNOSIS — Z23 Encounter for immunization: Secondary | ICD-10-CM

## 2019-05-08 NOTE — Progress Notes (Signed)
   Covid-19 Vaccination Clinic  Name:  Debra Gibson    MRN: [id number] DOB: 1970-07-06  05/08/2019  [name] was observed post Covid-19 immunization for 15 minutes without incident. She was provided with Vaccine Information Sheet and instruction to access the V-Safe system.   [name] was instructed to call 911 with any severe reactions post vaccine: Marland Kitchen Difficulty breathing  . Swelling of face and throat  . A fast heartbeat  . A bad rash all over body  . Dizziness and weakness   Immunizations Administered    Name Date Dose VIS Date Route   Pfizer COVID-19 Vaccine 05/08/2019  3:40 PM 0.3 mL 01/27/2019 Intramuscular   Manufacturer: [employer], [employer]   Lot: [id number]   NDC: [address]-3

## 2019-05-26 ENCOUNTER — Other Ambulatory Visit: Payer: Self-pay | Admitting: Surgery

## 2019-05-26 DIAGNOSIS — R1011 Right upper quadrant pain: Secondary | ICD-10-CM

## 2019-05-26 DIAGNOSIS — K219 Gastro-esophageal reflux disease without esophagitis: Secondary | ICD-10-CM

## 2019-06-01 ENCOUNTER — Other Ambulatory Visit: Payer: Self-pay | Admitting: Surgery

## 2019-06-01 DIAGNOSIS — R1011 Right upper quadrant pain: Secondary | ICD-10-CM

## 2019-06-03 ENCOUNTER — Ambulatory Visit: Payer: 59 | Attending: Internal Medicine

## 2019-06-03 DIAGNOSIS — Z23 Encounter for immunization: Secondary | ICD-10-CM

## 2019-06-03 NOTE — Progress Notes (Signed)
   Covid-19 Vaccination Clinic  Name:  JAMY WHYTE    MRN: [id number] DOB: July 26, 1970  06/03/2019  Ms. Orebaugh was observed post Covid-19 immunization for 15 minutes without incident. She was provided with Vaccine Information Sheet and instruction to access the V-Safe system.   Ms. Eyerly was instructed to call 911 with any severe reactions post vaccine: Marland Kitchen Difficulty breathing  . Swelling of face and throat  . A fast heartbeat  . A bad rash all over body  . Dizziness and weakness   Immunizations Administered    Name Date Dose VIS Date Route   Pfizer COVID-19 Vaccine 06/03/2019  3:14 PM 0.3 mL 01/27/2019 Intramuscular   Manufacturer: [employer], [employer]   Lot: [id number]   NDC: [address]-3

## 2019-06-09 ENCOUNTER — Ambulatory Visit
Admission: RE | Admit: 2019-06-09 | Discharge: 2019-06-09 | Disposition: A | Discharge status: Home / Self Care | Payer: 59 | Source: Ambulatory Visit | Attending: Surgery | Admitting: Surgery

## 2019-06-09 DIAGNOSIS — K219 Gastro-esophageal reflux disease without esophagitis: Secondary | ICD-10-CM

## 2019-06-14 ENCOUNTER — Ambulatory Visit
Admission: RE | Admit: 2019-06-14 | Discharge: 2019-06-14 | Disposition: A | Discharge status: Home / Self Care | Payer: 59 | Source: Ambulatory Visit | Attending: Surgery | Admitting: Surgery

## 2019-06-14 DIAGNOSIS — R1011 Right upper quadrant pain: Secondary | ICD-10-CM

## 2019-06-14 MED ORDER — IOPAMIDOL (ISOVUE-300) INJECTION 61%
100.0000 mL | Freq: Once | INTRAVENOUS | Status: AC | PRN
  Administered 2019-06-14: 100 mL via INTRAVENOUS

## 2019-11-30 ENCOUNTER — Encounter: Payer: Self-pay | Admitting: Neurology

## 2019-11-30 ENCOUNTER — Other Ambulatory Visit: Payer: Self-pay

## 2019-11-30 ENCOUNTER — Ambulatory Visit (INDEPENDENT_AMBULATORY_CARE_PROVIDER_SITE_OTHER): Payer: 59 | Admitting: Neurology

## 2019-11-30 VITALS — BP 118/84 | HR 88 | Ht 62.0 in | Wt 206.0 lb

## 2019-11-30 DIAGNOSIS — R351 Nocturia: Secondary | ICD-10-CM | POA: Diagnosis not present

## 2019-11-30 DIAGNOSIS — E669 Obesity, unspecified: Secondary | ICD-10-CM | POA: Diagnosis not present

## 2019-11-30 DIAGNOSIS — G4719 Other hypersomnia: Secondary | ICD-10-CM

## 2019-11-30 DIAGNOSIS — R0683 Snoring: Secondary | ICD-10-CM | POA: Diagnosis not present

## 2019-11-30 DIAGNOSIS — G4761 Periodic limb movement disorder: Secondary | ICD-10-CM

## 2019-11-30 DIAGNOSIS — G2581 Restless legs syndrome: Secondary | ICD-10-CM

## 2019-11-30 NOTE — Patient Instructions (Addendum)
It was good to see you today!  Based on your symptoms and your exam I believe you are at risk for obstructive sleep apnea (aka OSA), and I think we should proceed with a sleep study to determine whether you do or do not have OSA and how severe it is. Even, if you have mild OSA, I may want you to consider treatment with CPAP, as treatment of even borderline or mild sleep apnea can result and improvement of symptoms such as sleep disruption, daytime sleepiness, nighttime bathroom breaks, restless leg symptoms, improvement of headache syndromes, even improved mood disorder.   As explained, an attended sleep study meaning you get to stay overnight in the sleep lab, lets [place] monitor sleep-related behaviors such as sleep talking and leg movements in sleep, in addition to monitoring for sleep apnea.  A home sleep test is a screening tool for sleep apnea only, and unfortunately does not help with any other sleep-related diagnoses.  Please remember, the long-term risks and ramifications of untreated moderate to severe obstructive sleep apnea are: increased Cardiovascular disease, including congestive heart failure, stroke, difficult to control hypertension, treatment resistant obesity, arrhythmias, especially irregular heartbeat commonly known as A. Fib. (atrial fibrillation); even type 2 diabetes has been linked to untreated OSA.   Sleep apnea can cause disruption of sleep and sleep deprivation in most cases, which, in turn, can cause recurrent headaches, problems with memory, mood, concentration, focus, and vigilance. Most people with untreated sleep apnea report excessive daytime sleepiness, which can affect their ability to drive. Please do not drive if you feel sleepy. Patients with sleep apnea can also develop difficulty initiating and maintaining sleep (aka insomnia).   Having sleep apnea may increase your risk for other sleep disorders, including involuntary behaviors sleep such as sleep terrors, sleep  talking, sleepwalking.    Having sleep apnea can also increase your risk for restless leg syndrome and leg movements at night.   Please note that untreated obstructive sleep apnea may carry additional perioperative morbidity. Patients with significant obstructive sleep apnea (typically, in the moderate to severe degree) should receive, if possible, perioperative PAP (positive airway pressure) therapy and the surgeons and particularly the anesthesiologists should be informed of the diagnosis and the severity of the sleep disordered breathing.   I will likely see you back after your sleep study to go over the test results and where to go from there. We will call you after your sleep study to advise about the results (most likely, you will hear from [facility], my nurse) and to set up an appointment at the time, as necessary.    Our sleep lab administrative assistant will call you to schedule your sleep study and give you further instructions, regarding the check in process for the sleep study, arrival time, what to bring, when you can expect to leave after the study, etc., and to answer any other logistical questions you may have. If you don't hear back from her by about 2 weeks from now, please feel free to call her direct line at [phone] or you can call our general clinic number, or email [place] through My Chart.
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
## 2019-12-13 ENCOUNTER — Encounter: Payer: 59 | Admitting: Nurse Practitioner

## 2019-12-26 ENCOUNTER — Telehealth: Payer: Self-pay

## 2019-12-26 NOTE — Telephone Encounter (Signed)
LVM for pt to call me back to schedule sleep study  

## 2020-01-01 ENCOUNTER — Encounter: Payer: Self-pay | Admitting: Neurology

## 2020-01-22 ENCOUNTER — Ambulatory Visit (INDEPENDENT_AMBULATORY_CARE_PROVIDER_SITE_OTHER): Payer: 59 | Admitting: Neurology

## 2020-01-22 DIAGNOSIS — R351 Nocturia: Secondary | ICD-10-CM

## 2020-01-22 DIAGNOSIS — G4733 Obstructive sleep apnea (adult) (pediatric): Secondary | ICD-10-CM | POA: Diagnosis not present

## 2020-01-22 DIAGNOSIS — R0683 Snoring: Secondary | ICD-10-CM

## 2020-01-22 DIAGNOSIS — G4761 Periodic limb movement disorder: Secondary | ICD-10-CM

## 2020-01-22 DIAGNOSIS — E669 Obesity, unspecified: Secondary | ICD-10-CM

## 2020-01-22 DIAGNOSIS — G2581 Restless legs syndrome: Secondary | ICD-10-CM

## 2020-01-22 DIAGNOSIS — G4719 Other hypersomnia: Secondary | ICD-10-CM

## 2020-02-06 ENCOUNTER — Ambulatory Visit (INDEPENDENT_AMBULATORY_CARE_PROVIDER_SITE_OTHER): Payer: 59 | Admitting: Nurse Practitioner

## 2020-02-06 ENCOUNTER — Encounter: Payer: Self-pay | Admitting: Nurse Practitioner

## 2020-02-06 ENCOUNTER — Other Ambulatory Visit: Payer: Self-pay

## 2020-02-06 VITALS — BP 120/80 | Ht 63.0 in | Wt 204.0 lb

## 2020-02-06 DIAGNOSIS — Z01419 Encounter for gynecological examination (general) (routine) without abnormal findings: Secondary | ICD-10-CM

## 2020-02-06 DIAGNOSIS — Z30431 Encounter for routine checking of intrauterine contraceptive device: Secondary | ICD-10-CM | POA: Diagnosis not present

## 2020-02-06 DIAGNOSIS — N814 Uterovaginal prolapse, unspecified: Secondary | ICD-10-CM

## 2020-02-06 NOTE — Patient Instructions (Signed)
Health Maintenance, Female Adopting a healthy lifestyle and getting preventive care are important in promoting health and wellness. Ask your health care provider about:  The right schedule for you to have regular tests and exams.  Things you can do on your own to prevent diseases and keep yourself healthy. What should I know about diet, weight, and exercise? Eat a healthy diet   Eat a diet that includes plenty of vegetables, fruits, low-fat dairy products, and lean protein.  Do not eat a lot of foods that are high in solid fats, added sugars, or sodium. Maintain a healthy weight Body mass index (BMI) is used to identify weight problems. It estimates body fat based on height and weight. Your health care provider can help determine your BMI and help you achieve or maintain a healthy weight. Get regular exercise Get regular exercise. This is one of the most important things you can do for your health. Most adults should:  Exercise for at least 150 minutes each week. The exercise should increase your heart rate and make you sweat (moderate-intensity exercise).  Do strengthening exercises at least twice a week. This is in addition to the moderate-intensity exercise.  Spend less time sitting. Even light physical activity can be beneficial. Watch cholesterol and blood lipids Have your blood tested for lipids and cholesterol at 47 years of age, then have this test every 5 years. Have your cholesterol levels checked more often if:  Your lipid or cholesterol levels are high.  You are older than 47 years of age.  You are at high risk for heart disease. What should I know about cancer screening? Depending on your health history and family history, you may need to have cancer screening at various ages. This may include screening for:  Breast cancer.  Cervical cancer.  Colorectal cancer.  Skin cancer.  Lung cancer. What should I know about heart disease, diabetes, and high blood  pressure? Blood pressure and heart disease  High blood pressure causes heart disease and increases the risk of stroke. This is more likely to develop in people who have high blood pressure readings, are of African descent, or are overweight.  Have your blood pressure checked: ? Every 3-5 years if you are 18-39 years of age. ? Every year if you are 40 years old or older. Diabetes Have regular diabetes screenings. This checks your fasting blood sugar level. Have the screening done:  Once every three years after age 40 if you are at a normal weight and have a low risk for diabetes.  More often and at a younger age if you are overweight or have a high risk for diabetes. What should I know about preventing infection? Hepatitis B If you have a higher risk for hepatitis B, you should be screened for this virus. Talk with your health care provider to find out if you are at risk for hepatitis B infection. Hepatitis C Testing is recommended for:  Everyone born from 1945 through 1965.  Anyone with known risk factors for hepatitis C. Sexually transmitted infections (STIs)  Get screened for STIs, including gonorrhea and chlamydia, if: ? You are sexually active and are younger than 47 years of age. ? You are older than 47 years of age and your health care provider tells you that you are at risk for this type of infection. ? Your sexual activity has changed since you were last screened, and you are at increased risk for chlamydia or gonorrhea. Ask your health care provider if   you are at risk.  Ask your health care provider about whether you are at high risk for HIV. Your health care provider may recommend a prescription medicine to help prevent HIV infection. If you choose to take medicine to prevent HIV, you should first get tested for HIV. You should then be tested every 3 months for as long as you are taking the medicine. Pregnancy  If you are about to stop having your period (premenopausal) and  you may become pregnant, seek counseling before you get pregnant.  Take 400 to 800 micrograms (mcg) of folic acid every day if you become pregnant.  Ask for birth control (contraception) if you want to prevent pregnancy. Osteoporosis and menopause Osteoporosis is a disease in which the bones lose minerals and strength with aging. This can result in bone fractures. If you are 65 years old or older, or if you are at risk for osteoporosis and fractures, ask your health care provider if you should:  Be screened for bone loss.  Take a calcium or vitamin D supplement to lower your risk of fractures.  Be given hormone replacement therapy (HRT) to treat symptoms of menopause. Follow these instructions at home: Lifestyle  Do not use any products that contain nicotine or tobacco, such as cigarettes, e-cigarettes, and chewing tobacco. If you need help quitting, ask your health care provider.  Do not use street drugs.  Do not share needles.  Ask your health care provider for help if you need support or information about quitting drugs. Alcohol use  Do not drink alcohol if: ? Your health care provider tells you not to drink. ? You are pregnant, may be pregnant, or are planning to become pregnant.  If you drink alcohol: ? Limit how much you use to 0-1 drink a day. ? Limit intake if you are breastfeeding.  Be aware of how much alcohol is in your drink. In the [place] one drink equals one 12 oz bottle of beer (355 mL), one 5 oz glass of wine (148 mL), or one 1 oz glass of hard liquor (44 mL). General instructions  Schedule regular health, dental, and eye exams.  Stay current with your vaccines.  Tell your health care provider if: ? You often feel depressed. ? You have ever been abused or do not feel safe at home. Summary  Adopting a healthy lifestyle and getting preventive care are important in promoting health and wellness.  Follow your health care provider's instructions about healthy  diet, exercising, and getting tested or screened for diseases.  Follow your health care provider's instructions on monitoring your cholesterol and blood pressure. This information is not intended to replace advice given to you by your health care provider. Make sure you discuss any questions you have with your health care provider. Document Revised: 01/26/2018 Document Reviewed: 01/26/2018 Elsevier Patient Education  2020 [employer]  

## 2020-02-06 NOTE — Progress Notes (Signed)
   Debra Gibson 12-18-1970 [id number]   History:  49 y.o. G2P0011 presents for annual exam. She does complain of mild urinary leakage that has progressed over the years. Amenorrheic /Mirena IUD inserted March 2017. Normal pap and mammogram history. T2DM managed by primary care.   Gynecologic History No LMP recorded. (Menstrual status: IUD).   Contraception: IUD Last Pap: 12/12/2018. Results were: normal Last mammogram: 06/16/2019. Results were: normal  Past medical history, past surgical history, family history and social history were all reviewed and documented in the EPIC chart.  ROS:  A ROS was performed and pertinent positives and negatives are included.  Exam:  Vitals:   02/06/20 1051  BP: 120/80  Weight: 204 lb (92.5 kg)  Height: 5\' 3"  (1.6 m)   Body mass index is 36.14 kg/m.  General appearance:  Normal Thyroid:  Symmetrical, normal in size, without palpable masses or nodularity. Respiratory  Auscultation:  Clear without wheezing or rhonchi Cardiovascular  Auscultation:  Regular rate, without rubs, murmurs or gallops  Edema/varicosities:  Not grossly evident Abdominal  Soft,nontender, without masses, guarding or rebound.  Liver/spleen:  No organomegaly noted  Hernia:  None appreciated  Skin  Inspection:  Grossly normal   Breasts: Examined lying and sitting.   Right: Without masses, retractions, discharge or axillary adenopathy.   Left: Without masses, retractions, discharge or axillary adenopathy. Gentitourinary   Inguinal/mons:  Normal without inguinal adenopathy  External genitalia:  Normal  BUS/Urethra/Skene's glands:  Normal  Vagina:  Atrophic changes, stage 1 uterine prolaps  Cervix:  Normal, IUD string visualized  Uterus:  Normal in size, shape and contour.  Midline and mobile  Adnexa/parametria:     Rt: Without masses or tenderness.   Lt: Without masses or tenderness.  Anus and perineum: Normal  Digital rectal exam: Normal sphincter tone without  palpated masses or tenderness  Assessment/Plan:  49 y.o. G2P0011 for annual exam.   Well woman exam with routine gynecological exam - Education provided on SBEs, importance of preventative screenings, current guidelines, high calcium diet, regular exercise, and multivitamin daily. Labs with PCP.   Encounter for routine checking of intrauterine contraceptive device (IUD) -Mirena inserted March 2017.  Amenorrheic.  Plans to have this exchanged in March 2022.  Is not having any menopausal symptoms.  IUD string visualized in cervical os during exam.  Uterine prolapse -stage I prolapse.  Educated patient on preventing further progression and that this is most likely the cause of her urinary leakage.  Recommend weight loss, Kegel exercises, pelvic floor strengthening exercises, and bladder training.  She is interested in pelvic floor PT.  We will send referral for this.  Screening for cervical cancer - Normal Pap history.  Will repeat at 5-year interval per guidelines.  Screening for breast cancer - Normal mammogram history.  Continue annual screenings.  Normal breast exam today.  Screening for colon cancer - Will start screenings at age 45.  Follow-up in 1 year for annual.      44 [facility], 10:56 AM 02/06/2020

## 2020-02-07 ENCOUNTER — Telehealth: Payer: Self-pay | Admitting: *Deleted

## 2020-02-07 DIAGNOSIS — N39498 Other specified urinary incontinence: Secondary | ICD-10-CM

## 2020-02-07 DIAGNOSIS — N814 Uterovaginal prolapse, unspecified: Secondary | ICD-10-CM

## 2020-02-07 NOTE — Telephone Encounter (Signed)
Referral placed at [facility] they will call to schedule.  °

## 2020-02-07 NOTE — Telephone Encounter (Signed)
-----   Message from [name], NP sent at 02/06/2020 11:08 AM EST ----- Please send referral for Pelvic floor PT for uterine prolapse and urinary incontinence. Thank you.

## 2020-02-11 NOTE — Addendum Note (Signed)
Addended by: [name] on: 02/11/2020 11:32 AM   Modules accepted: Orders

## 2020-02-11 NOTE — Procedures (Signed)
   [facility]  HOME SLEEP TEST (Watch PAT)  STUDY DATE: 01/22/20  DOB: [birth year]  MRN: [id number]  ORDERING CLINICIAN: [name], MD, PhD   REFERRING CLINICIAN: Dr. Selena Batten  CLINICAL INFORMATION/HISTORY: 49 year old woman with a history of diabetes, neuropathy, depression, reflux disease, hyperlipidemia, vitamin D deficiency, anxiety, restless leg syndrome, anemia, and obesity, who reports excessive daytime somnolence. She previously tried autoPAP, but no longer has a machine.   Epworth sleepiness score: 10/24.  BMI: 38.1 kg/m  FINDINGS:   Total Record Time (hours, min): 10 H 3 min  Total Sleep Time (hours, min):  7 H 33 min   Percent REM (%):    14.43 %   Calculated pAHI (per hour):  13.1       REM pAHI:    27.7     NREM pAHI: 10.6   Oxygen Saturation (%) Mean: 94  Minimum oxygen saturation (%):        84   O2 Saturation Range (%): 84-97  O2Saturation (minutes) <=88%: .1 min   Pulse Mean (bpm):    82  Pulse Range (51-121)   IMPRESSION: OSA (obstructive sleep apnea), mild  RECOMMENDATION:  This home sleep test demonstrates overall mild obstructive sleep apnea with a total AHI of 13.1/hour and O2 nadir of 84%. Given the patient's medical history and sleep related complaints, treatment with positive airway pressure is recommended. This can be achieved in the form of autoPAP trial/titration at home. A full night CPAP titration study will help with proper treatment settings and mask fitting, if needed, down the road. Alternative treatments include weight loss along with avoidance of the supine sleep position, or an oral appliance in appropriate candidates.   Please note that untreated obstructive sleep apnea may carry additional perioperative morbidity. Patients with significant obstructive sleep apnea should receive perioperative PAP therapy and the surgeons and particularly the anesthesiologist should be informed of the diagnosis and the severity of the sleep  disordered breathing. The patient should be cautioned not to drive, work at heights, or operate dangerous or heavy equipment when tired or sleepy. Review and reiteration of good sleep hygiene measures should be pursued with any patient. Other causes of the patient's symptoms, including circadian rhythm disturbances, an underlying mood disorder, medication effect and/or an underlying medical problem cannot be ruled out based on this test. Clinical correlation is recommended.   The patient and her referring provider will be notified of the test results. The patient will be seen in follow up in sleep clinic at [facility].  I certify that I have reviewed the raw data recording prior to the issuance of this report in accordance with the standards of the American Academy of Sleep Medicine (AASM).   INTERPRETING PHYSICIAN:    [name], MD, PhD  Board Certified in Neurology and Sleep Medicine [facility] [address] [place], [place] [address] [phone]

## 2020-02-11 NOTE — Progress Notes (Signed)
Patient referred by Dr. Selena Batten for re-eval of her sleep disturbance. She previously had an autoPAP, seen by me on 11/30/19, diagnostic PSG on 01/22/20.    Please call and notify the patient that the recent home sleep test showed obstructive sleep apnea. OSA is overall mild, but worth treating to see if she feels better after treatment. To that end I recommend treatment for this in the form of autoPAP, which means, that we don't have to bring her in for a sleep study with CPAP, but will let her try an autoPAP machine at home, through a DME company (of her choice, or as per insurance requirement). The DME representative will educate her on how to use the machine, how to put the mask on, etc. I have placed an order in the chart. Please send referral, talk to patient, send report to referring MD. We will need a FU in sleep clinic for 10 weeks post-PAP set up, please arrange that with me or one of our NPs. Thanks,   [name], MD, PhD [facility])

## 2020-02-12 ENCOUNTER — Telehealth: Payer: Self-pay | Admitting: Neurology

## 2020-02-12 NOTE — Telephone Encounter (Signed)
-----   Message from [name], MD sent at 02/11/2020 11:32 AM EST ----- Patient referred by Dr. Selena Batten for re-eval of her sleep disturbance. She previously had an autoPAP, seen by me on 11/30/19, diagnostic PSG on 01/22/20.    Please call and notify the patient that the recent home sleep test showed obstructive sleep apnea. OSA is overall mild, but worth treating to see if she feels better after treatment. To that end I recommend treatment for this in the form of autoPAP, which means, that we don't have to bring her in for a sleep study with CPAP, but will let her try an autoPAP machine at home, through a DME company (of her choice, or as per insurance requirement). The DME representative will educate her on how to use the machine, how to put the mask on, etc. I have placed an order in the chart. Please send referral, talk to patient, send report to referring MD. We will need a FU in sleep clinic for 10 weeks post-PAP set up, please arrange that with me or one of our NPs. Thanks,   [name], MD, PhD [facility])

## 2020-02-12 NOTE — Telephone Encounter (Signed)
I called pt. I advised pt that Dr. Frances Furbish reviewed their sleep study results and found that pt has mild sleep apnea. Dr. Frances Furbish recommends that pt starts auto CPAP. I reviewed PAP compliance expectations with the pt. Pt is agreeable to starting a CPAP. I advised pt that an order will be sent to a DME, [facility] and [facility] will call the pt within about one week after they file with the pt's insurance. [facility]) will show the pt how to use the machine, fit for masks, and troubleshoot the CPAP if needed. A follow up appt will need to be made for insurance purposes with Dr. Frances Furbish or the NP. Pt verbalized understanding to arrive 15 minutes early and bring their CPAP. A letter with all of this information in it will be mailed to the pt as a reminder. Pt verbalized understanding of results. Pt had no questions at this time but was encouraged to call back if questions arise. I have sent the order to [facility])  and have received confirmation that they have received the order.

## 2020-02-12 NOTE — Telephone Encounter (Signed)
Called patient to discuss sleep study results. No answer at this time. LVM for the patient to call back.   

## 2020-02-28 NOTE — Telephone Encounter (Signed)
[facility] said "Transfer referral to [facility] the patient wants a closer location."

## 2020-03-27 ENCOUNTER — Other Ambulatory Visit: Payer: Self-pay

## 2020-03-27 DIAGNOSIS — Z30433 Encounter for removal and reinsertion of intrauterine contraceptive device: Secondary | ICD-10-CM

## 2020-04-26 ENCOUNTER — Ambulatory Visit: Payer: 59 | Admitting: Obstetrics & Gynecology

## 2020-05-09 ENCOUNTER — Other Ambulatory Visit: Payer: Self-pay

## 2020-05-09 ENCOUNTER — Ambulatory Visit (INDEPENDENT_AMBULATORY_CARE_PROVIDER_SITE_OTHER): Payer: 59 | Admitting: Obstetrics & Gynecology

## 2020-05-09 ENCOUNTER — Encounter: Payer: Self-pay | Admitting: Obstetrics & Gynecology

## 2020-05-09 DIAGNOSIS — Z30433 Encounter for removal and reinsertion of intrauterine contraceptive device: Secondary | ICD-10-CM | POA: Diagnosis not present

## 2020-05-09 NOTE — Progress Notes (Signed)
    Debra Gibson 03-03-1970 [id number]        50 y.o.  G2P0011   RP: Mirena IUD insertion and removal  HPI: Time to change Mirena IUD.  No BTB.  No pelvic pain.  Normal vaginal secretions.   OB History  Gravida Para Term Preterm AB Living  2 1     1 1   SAB IAB Ectopic Multiple Live Births  1            # Outcome Date GA Lbr Len/2nd Weight Sex Delivery Anes PTL Lv  2 SAB           1 Para             Past medical history,surgical history, problem list, medications, allergies, family history and social history were all reviewed and documented in the EPIC chart.   Directed ROS with pertinent positives and negatives documented in the history of present illness/assessment and plan.  Exam:  Vitals:   05/09/20 1427  BP: 126/84   General appearance:  Normal                                                                    IUD procedure note       Patient presented to the office today for removal and placement of Mirena IUD. The patient had previously been provided with literature information on this method of contraception. The risks benefits and pros and cons were discussed and all her questions were answered. She is fully aware that this form of contraception is 99% effective and is good for 5 years.  Pelvic exam: Vulva normal Vagina: No lesions or discharge Cervix: No lesions or discharge.  IUD strings visible.  Easy removal of IUD by pulling on strings.  IUD intact and complete.   Uterus: AV position, normal volume Adnexa: No masses or tenderness Rectal exam: Not done  The cervix was cleansed with Betadine solution. Hurricane spray on the cervix.  A single-tooth tenaculum was placed on the anterior cervical lip. Os finder used to mildly dilate the cervix, hysterometry at 7 cm.  The IUD was shown to the patient and inserted in a sterile fashion. The IUD string was trimmed. The single-tooth tenaculum was removed. Patient was instructed to return back to the office in one  month for follow up.        Assessment/Plan:  50 y.o. G2P0011   1. Encounter for removal and reinsertion of intrauterine contraceptive device (IUD) Easy removal of Mirena IUD and insertion of a new Mirena IUD.  No complication.  Well tolerated by patient.  Post procedure precautions reviewed.  F/U 4 weeks for Mirena IUD check. - IUD removal - IUD Insertion  52 MD, 2:41 PM 05/09/2020

## 2020-05-21 ENCOUNTER — Encounter: Payer: Self-pay | Admitting: Anesthesiology

## 2020-06-12 ENCOUNTER — Encounter: Payer: Self-pay | Admitting: Obstetrics & Gynecology

## 2020-06-12 ENCOUNTER — Other Ambulatory Visit: Payer: Self-pay

## 2020-06-12 ENCOUNTER — Ambulatory Visit (INDEPENDENT_AMBULATORY_CARE_PROVIDER_SITE_OTHER): Payer: 59 | Admitting: Obstetrics & Gynecology

## 2020-06-12 VITALS — BP 126/80

## 2020-06-12 DIAGNOSIS — Z30431 Encounter for routine checking of intrauterine contraceptive device: Secondary | ICD-10-CM

## 2020-06-12 NOTE — Progress Notes (Signed)
    Debra Gibson 06-Apr-1970 [id number]        50 y.o.  G2P0011   RP: Mirena IUD check 4 weeks post insertion  HPI: Doing well with no abdominopelvic pain.  No abnormal bleeding or vaginal discharge.  No fever.  Not sexually active x insertion of IUD.   OB History  Gravida Para Term Preterm AB Living  2 1     1 1   SAB IAB Ectopic Multiple Live Births  1            # Outcome Date GA Lbr Len/2nd Weight Sex Delivery Anes PTL Lv  2 SAB           1 Para             Past medical history,surgical history, problem list, medications, allergies, family history and social history were all reviewed and documented in the EPIC chart.   Directed ROS with pertinent positives and negatives documented in the history of present illness/assessment and plan.  Exam:  Vitals:   06/12/20 1543  BP: 126/80   General appearance:  Normal  Abdomen: Normal  Gynecologic exam: Vulva normal.  Speculum:  Cervix normal, no erythema.  [facility]UD strings visible at [facility].  Vagina normal.  Normal secretions, no blood.   Assessment/Plan:  50 y.o. G2P0011   1. Encounter for routine checking of intrauterine contraceptive device (IUD) Well with [place]ott IUD since insertion.  IUD in good location with no sign of infection.  Patient reassured.  Follow-up annual gynecologic exam.  48 MD, 4:01 PM 06/12/2020

## 2020-06-15 ENCOUNTER — Encounter: Payer: Self-pay | Admitting: Obstetrics & Gynecology

## 2020-10-15 ENCOUNTER — Telehealth: Payer: Self-pay | Admitting: *Deleted

## 2020-10-15 NOTE — Telephone Encounter (Signed)
We received a fax from [employer] stating patient has been set up on a Luna machine starting 10/10/2020.  She will need an appointment for insurance purposes between 11/10/20 and 01/07/21. I called the pt and LVM asking for call back to schedule her. When she calls back, please schedule her for an initial CPAP f/u between the dates mentioned above and advise patient to bring machine & power cord to the appointment so we can collect data. Also label appt note "initial CPAP; Luna; pt to bring machine and power cord to appt".

## 2020-12-16 ENCOUNTER — Other Ambulatory Visit: Payer: Self-pay

## 2020-12-16 ENCOUNTER — Ambulatory Visit (INDEPENDENT_AMBULATORY_CARE_PROVIDER_SITE_OTHER): Payer: 59 | Admitting: Neurology

## 2020-12-16 ENCOUNTER — Encounter: Payer: Self-pay | Admitting: Neurology

## 2020-12-16 VITALS — BP 117/77 | HR 85 | Ht 62.0 in | Wt 217.8 lb

## 2020-12-16 DIAGNOSIS — G4733 Obstructive sleep apnea (adult) (pediatric): Secondary | ICD-10-CM

## 2020-12-16 DIAGNOSIS — Z9989 Dependence on other enabling machines and devices: Secondary | ICD-10-CM | POA: Diagnosis not present

## 2020-12-16 DIAGNOSIS — Z789 Other specified health status: Secondary | ICD-10-CM

## 2020-12-16 NOTE — Patient Instructions (Addendum)
As always, it was nice to see you again.  I am pleased to see that your sleep apnea is under good control and you have fulfilled insurance criteria for compliance.  I am sorry you still struggling with that full facemask.  I would like for you to try a different style of fullface mask, I have written an order for this to send to your DME company directly.  Please also talk to them about the heated humidity and how to turn the heat off if possible.    Please continue using your autoPAP regularly. While your insurance requires that you use PAP at least 4 hours each night on 70% of the nights, I recommend, that you not skip any nights and use it throughout the night if you can. Getting used to PAP and staying with the treatment long term does take time and patience and discipline. Untreated obstructive sleep apnea when it is moderate to severe can have an adverse impact on cardiovascular health and raise her risk for heart disease, arrhythmias, hypertension, congestive heart failure, stroke and diabetes. Untreated obstructive sleep apnea causes sleep disruption, nonrestorative sleep, and sleep deprivation. This can have an impact on your day to day functioning and cause daytime sleepiness and impairment of cognitive function, memory loss, mood disturbance, and problems focussing. Using PAP regularly can improve these symptoms.  Please follow-up in this clinic to see one of our nurse practitioners in about 6 months, sooner if needed.
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
## 2020-12-17 NOTE — Progress Notes (Addendum)
CM sent to [facility] for new mask.  [address] RN Ross Ludwig, RN got it!   Thanks!      Previous Messages   ----- Message -----  From: Guy Begin, RN  Sent: 12/18/2020   9:43 AM EDT  To: Wilford Sports  Subject: FW: new mask                                   Hey I called pt and [employer] is her DME.  She has a LUNA machine. This was what we have:      Bertram Savin, RN Tue Oct 15, 2020  9:35 AM     DME: Avanell Shackleton ([facility])  Patrina Levering  Setup date: 10/10/20   Can you recheck again.  Thank you Andrey Campanile RN   ----- Message -----  From: Dimas Millin  Sent: 12/17/2020   3:05 PM EDT  To: Jari Favre, Guy Begin, RN  Subject: RE: new mask                                   Hey there!

## 2021-01-06 ENCOUNTER — Ambulatory Visit: Payer: 59 | Admitting: Neurology

## 2021-05-30 ENCOUNTER — Ambulatory Visit: Payer: 59 | Admitting: Physician Assistant

## 2021-06-16 ENCOUNTER — Ambulatory Visit: Payer: Self-pay | Admitting: Adult Health

## 2021-07-30 ENCOUNTER — Encounter: Payer: Self-pay | Admitting: Obstetrics & Gynecology

## 2021-09-02 IMAGING — CT CT ABD-PELV W/ CM
2 of 5 series · 17 of 46 positions shown, 19 images · IV contrast (iopamidol)
Comparison: None.

CLINICAL DATA: Chronic right upper quadrant pain for several years.
Prior cholecystectomy.

Creatinine was obtained on site at [HOSPITAL] at [REDACTED].
Results: Creatinine 0.7 mg/dL.
EXAM:
CT ABDOMEN AND PELVIS WITH CONTRAST
TECHNIQUE: Multidetector CT imaging of the abdomen and pelvis was performed
using the standard protocol following bolus administration of
intravenous contrast.
CONTRAST:  100mL [id number] IOPAMIDOL ([id number]) INJECTION 61%

[Series 2: abd pelvis 5.00 br40 s3 axial · axial · 0.91mm/px · z∈[+1166,+1571]mm · 14 of 93 slices shown, 16 images]
[im 6/93  soft-tissue]
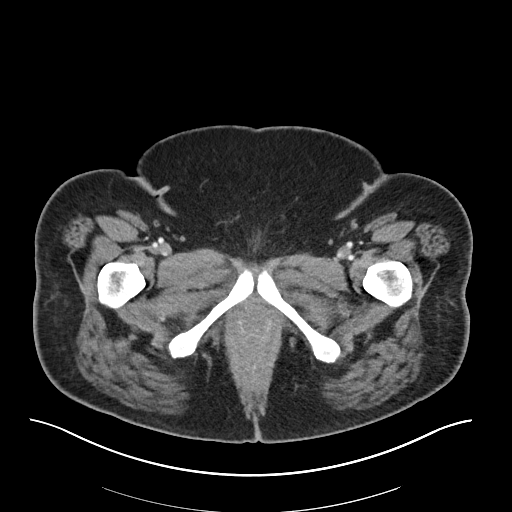
[im 6/93  bone]
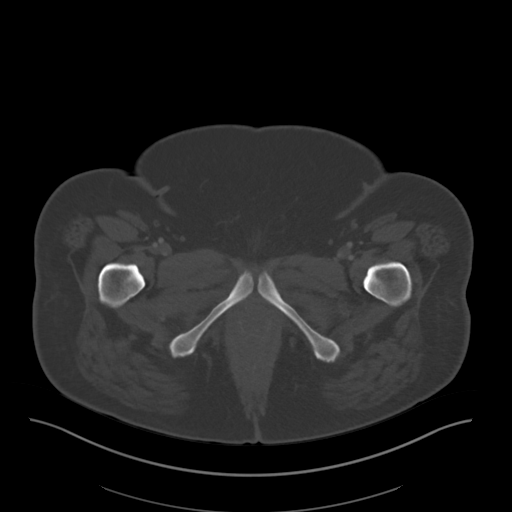
[im 11/93  soft-tissue]
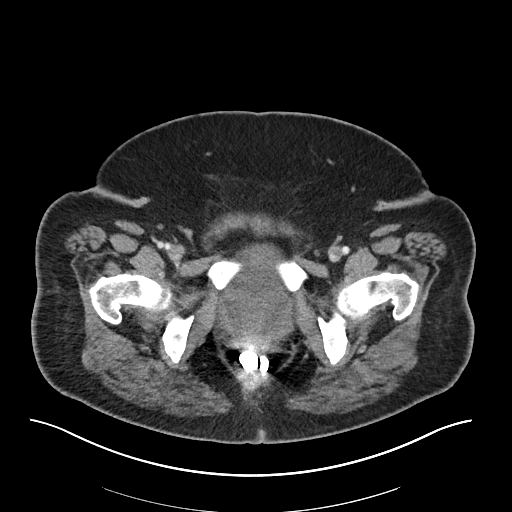
[im 21/93  soft-tissue]
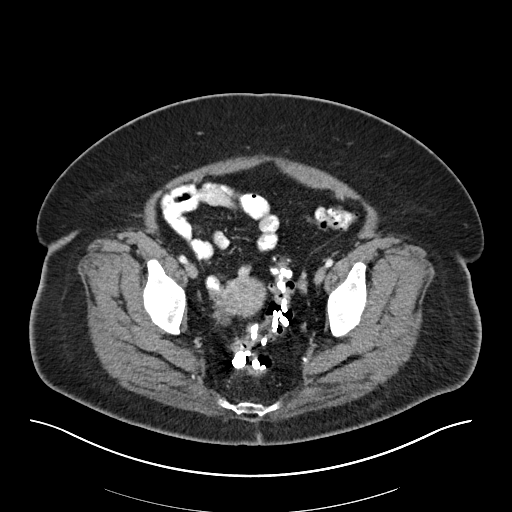
[im 26/93  soft-tissue]
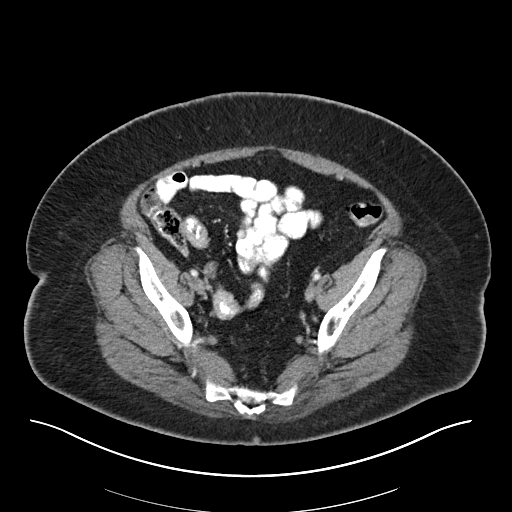
[im 31/93  soft-tissue]
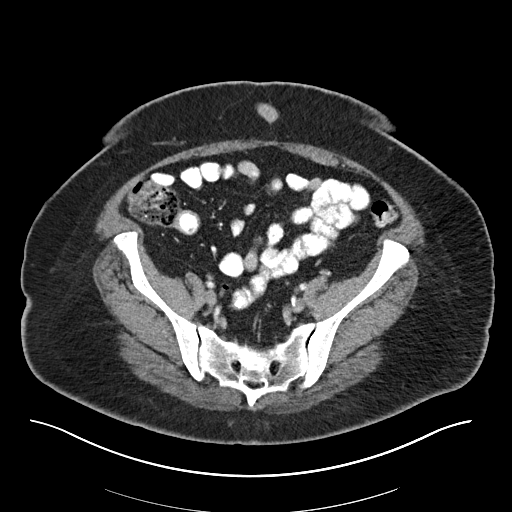
[im 36/93  soft-tissue]
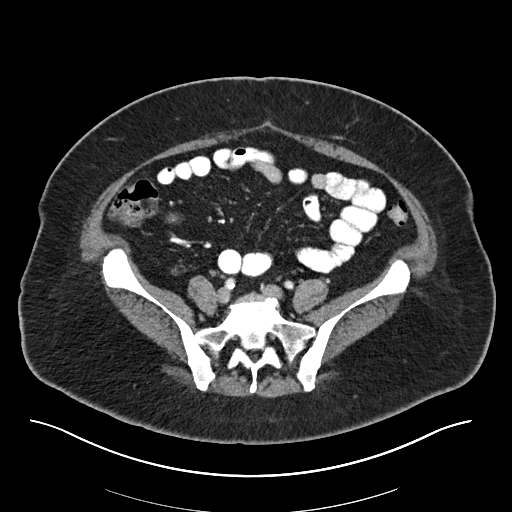
[im 41/93  soft-tissue]
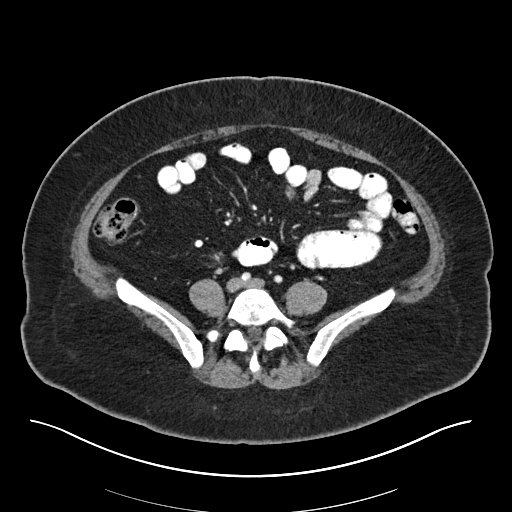
[im 52/93  soft-tissue]
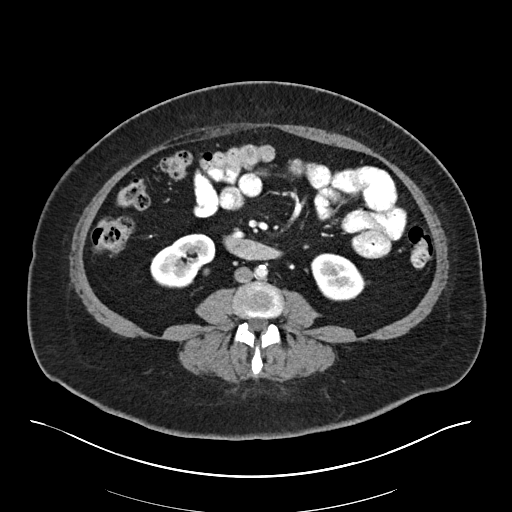
[im 57/93  soft-tissue]
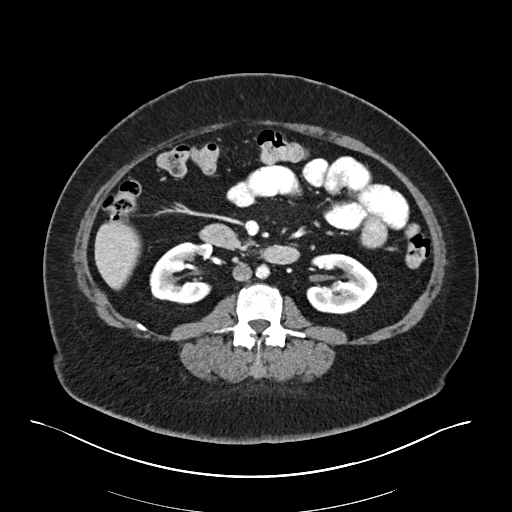
[im 57/93  bone]
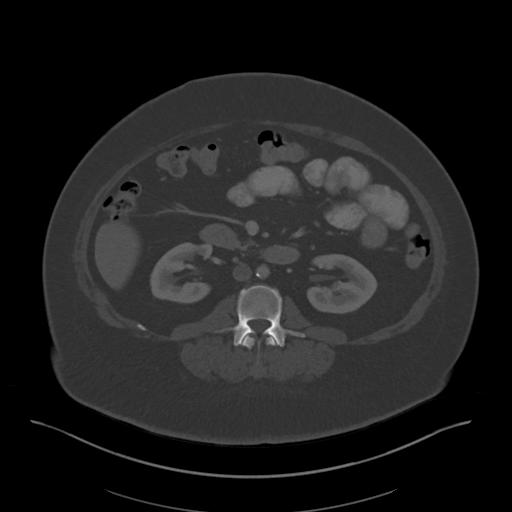
[im 62/93  soft-tissue]
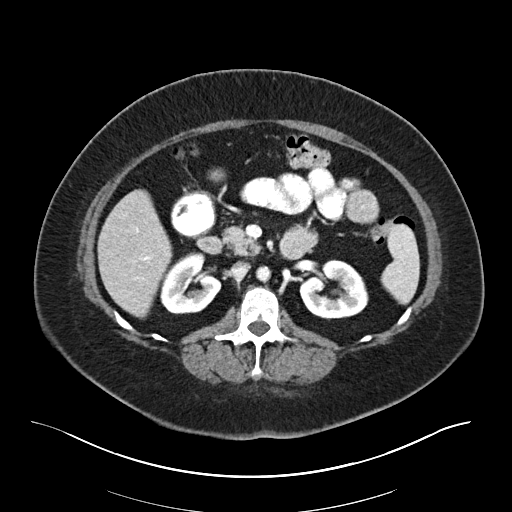
[im 67/93  soft-tissue]
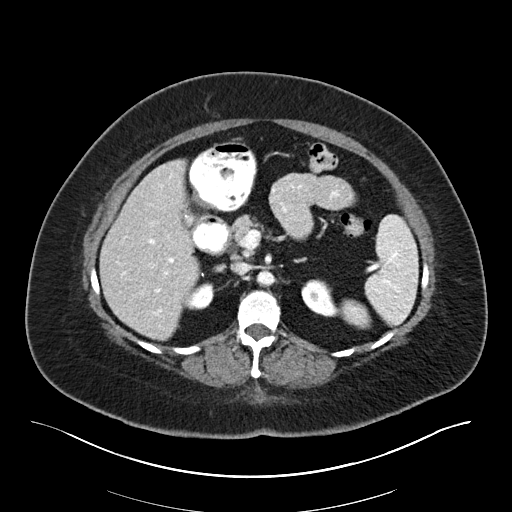
[im 72/93  soft-tissue]
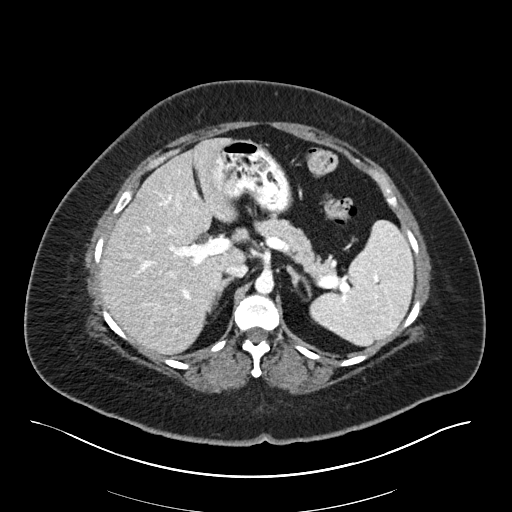
[im 82/93  soft-tissue]
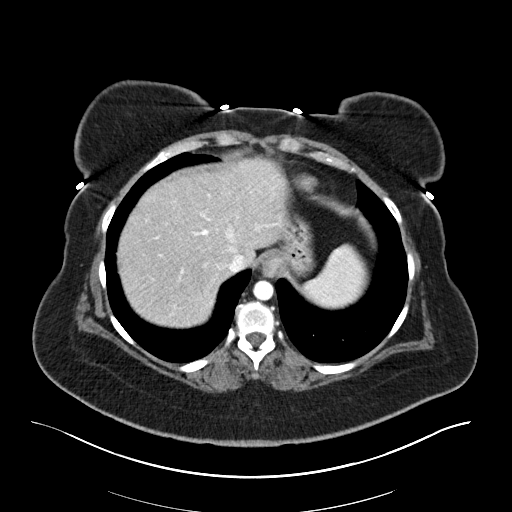
[im 87/93  soft-tissue]
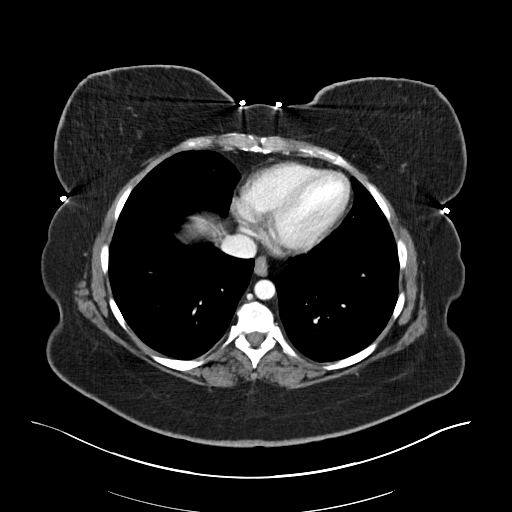

[Series 6: abd pelvis 2.00 br40 s3 cor · coronal · 0.91mm/px · 3 of 232 slices shown]
[im 78/232  soft-tissue]
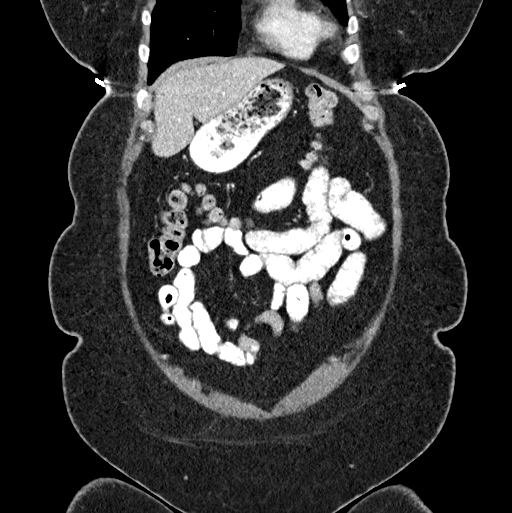
[im 103/232  soft-tissue]
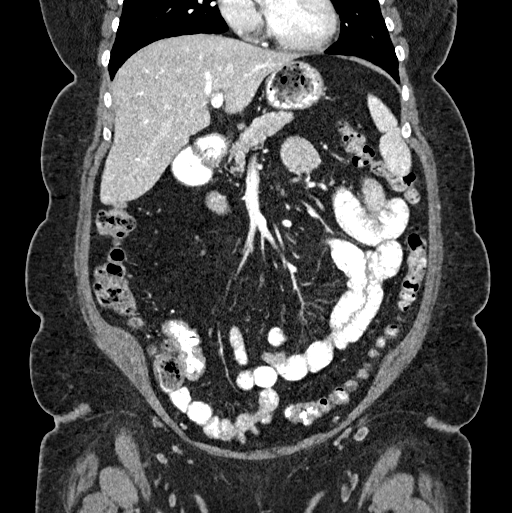
[im 129/232  soft-tissue]
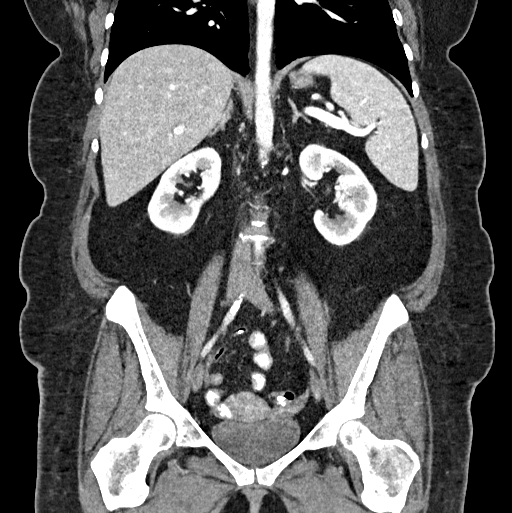

[17 of 46 positions shown; findings below may reference images not displayed]

FINDINGS: Lower Chest: No acute findings.

Hepatobiliary: No hepatic masses identified. Prior cholecystectomy.
No evidence of biliary obstruction.

Pancreas:  No mass or inflammatory changes.

Spleen: Within normal limits in size and appearance.

Adrenals/Urinary Tract: No masses identified. No evidence of
ureteral calculi or hydronephrosis.

Stomach/Bowel: No evidence of obstruction, inflammatory process or
abnormal fluid collections. Normal appendix visualized.

Vascular/Lymphatic: No pathologically enlarged lymph nodes. No
abdominal aortic aneurysm. Aortic atherosclerosis incidentally
noted.

Reproductive: No mass or other significant abnormality. IUD seen in
appropriate position.

Other:  None.

Musculoskeletal:  No suspicious bone lesions identified.
IMPRESSION: Prior cholecystectomy. No evidence of biliary ductal dilatation or
significant abnormality.

IUD in appropriate position.

Aortic Atherosclerosis ([id number]).

## 2021-12-01 ENCOUNTER — Ambulatory Visit (INDEPENDENT_AMBULATORY_CARE_PROVIDER_SITE_OTHER): Payer: 59

## 2021-12-01 ENCOUNTER — Ambulatory Visit (INDEPENDENT_AMBULATORY_CARE_PROVIDER_SITE_OTHER): Payer: 59 | Admitting: Orthopaedic Surgery

## 2021-12-01 VITALS — Ht 62.0 in | Wt 205.0 lb

## 2021-12-01 DIAGNOSIS — G8929 Other chronic pain: Secondary | ICD-10-CM

## 2021-12-01 DIAGNOSIS — M25562 Pain in left knee: Secondary | ICD-10-CM

## 2021-12-01 NOTE — Progress Notes (Signed)
The patient is a 52 year old female that comes in with left knee pain has been hurting for a few months now after mechanical fall.  She has never had surgery on her knee before and never any other injury until she had a fall.  She points to the patella tendon as the source of her pain and says it is worse when going up and down stairs.  It is not severe though.  On exam there is no knee joint effusion of her left knee.  She has full range of motion actively and passively of that knee.  It is ligamentously stable.  She only has a little bit of tenderness over the patella tendon.  Her extensor mechanism is intact.  Her McMurray's exam and Lachman's exam are normal.  An AP and lateral as well as sunrise views of the left knee are unremarkable.  The knee alignment is well-maintained and the joint spaces well-maintained.  This did give her reassurance that mechanically her knee is doing well.  I have recommended at least trying Voltaren gel and if her knee continues to hurt we can always try a steroid injection.  All questions and concerns were answered and addressed.  Follow-up is as needed.

## 2022-04-20 ENCOUNTER — Other Ambulatory Visit: Payer: Self-pay | Admitting: Registered Nurse

## 2022-04-20 DIAGNOSIS — Z8249 Family history of ischemic heart disease and other diseases of the circulatory system: Secondary | ICD-10-CM

## 2022-05-11 ENCOUNTER — Ambulatory Visit
Admission: RE | Admit: 2022-05-11 | Discharge: 2022-05-11 | Disposition: A | Discharge status: Home / Self Care | Payer: 59 | Source: Ambulatory Visit | Attending: Registered Nurse | Admitting: Registered Nurse

## 2022-05-11 DIAGNOSIS — Z8249 Family history of ischemic heart disease and other diseases of the circulatory system: Secondary | ICD-10-CM | POA: Insufficient documentation

## 2022-05-12 ENCOUNTER — Other Ambulatory Visit (HOSPITAL_COMMUNITY): Payer: Self-pay

## 2022-05-12 ENCOUNTER — Other Ambulatory Visit: Payer: Self-pay

## 2022-05-12 MED ORDER — MOUNJARO 15 MG/0.5ML ~~LOC~~ SOAJ
15.0000 mg | SUBCUTANEOUS | 2 refills | Status: AC
  Filled 2022-05-12 (×2): qty 2, 28d supply, fill #0

## 2022-05-13 ENCOUNTER — Other Ambulatory Visit (HOSPITAL_COMMUNITY): Payer: Self-pay

## 2022-07-16 ENCOUNTER — Ambulatory Visit: Payer: Self-pay | Admitting: Obstetrics & Gynecology

## 2022-10-07 ENCOUNTER — Ambulatory Visit (INDEPENDENT_AMBULATORY_CARE_PROVIDER_SITE_OTHER): Payer: 59 | Admitting: Nurse Practitioner

## 2022-10-07 ENCOUNTER — Encounter: Payer: Self-pay | Admitting: Nurse Practitioner

## 2022-10-07 VITALS — BP 106/74 | HR 62 | Ht 62.5 in | Wt 195.0 lb

## 2022-10-07 DIAGNOSIS — Z01419 Encounter for gynecological examination (general) (routine) without abnormal findings: Secondary | ICD-10-CM | POA: Diagnosis not present

## 2022-10-07 DIAGNOSIS — Z30431 Encounter for routine checking of intrauterine contraceptive device: Secondary | ICD-10-CM | POA: Diagnosis not present

## 2022-10-07 NOTE — Progress Notes (Signed)
   Debra Gibson May 29, 1970 [id number]   History:  52 y.o. G2P0011 presents for annual exam. Mirena IUD 04/2020, amenorrheic. Normal pap and mammogram history. T2DM managed by primary care.   Gynecologic History No LMP recorded. (Menstrual status: IUD).   Contraception: abstinence and IUD Sexually active: No  Health maintenance Last Pap: 12/12/2018. Results were: Normal neg HPV, 5-year repeat Last mammogram: 07/28/2021. Results were: Normal Last colonoscopy: 2023. Results were: Polyps per patient, 10-year recall Last Dexa: Not indicated  Past medical history, past surgical history, family history and social history were all reviewed and documented in the EPIC chart. 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 54 yo daughter.  ROS:  A ROS was performed and pertinent positives and negatives are included.  Exam:  Vitals:   10/07/22 1029  BP: 106/74  Pulse: 62  SpO2: 98%  Weight: 195 lb (88.5 kg)  Height: 5' 2.5" (1.588 m)    Body mass index is 35.1 kg/m.  General appearance:  Normal Thyroid:  Symmetrical, normal in size, without palpable masses or nodularity. Respiratory  Auscultation:  Clear without wheezing or rhonchi Cardiovascular  Auscultation:  Regular rate, without rubs, murmurs or gallops  Edema/varicosities:  Not grossly evident Abdominal  Soft,nontender, without masses, guarding or rebound.  Liver/spleen:  No organomegaly noted  Hernia:  None appreciated  Skin  Inspection:  Grossly normal Breasts: Examined lying and sitting.   Right: Without masses, retractions, discharge or axillary adenopathy.   Left: Without masses, retractions, discharge or axillary adenopathy. Gentitourinary   Inguinal/mons:  Normal without inguinal adenopathy  External genitalia:  Normal  BUS/Urethra/Skene's glands:  Normal  Vagina:  Atrophic changes, stage 1 uterine prolaps  Cervix:  Normal, IUD string visualized  Uterus:  Normal in  size, shape and contour.  Midline and mobile  Adnexa/parametria:     Rt: Without masses or tenderness.   Lt: Without masses or tenderness.  Anus and perineum: Normal  Digital rectal exam: Not indicated  Assessment/Plan:  52 y.o. G2P0011 for annual exam.   Well woman exam with routine gynecological exam - Education provided on SBEs, importance of preventative screenings, current guidelines, high calcium diet, regular exercise, and multivitamin daily. Labs with PCP.   Encounter for routine checking of intrauterine contraceptive device (IUD) -Mirena 04/2020, amenorrheic. Aware of 8-year FDA approval.   Screening for cervical cancer - Normal Pap history.  Will repeat at 5-year interval per guidelines.  Screening for breast cancer - Normal mammogram history.  Continue annual screenings. Mammogram scheduled today. Normal breast exam.  Screening for colon cancer - 2023 colonoscopy. Will repeat at 10-year interval per GI recommendation.   Follow-up in 1 year for annual.      Debra Gibson [facility], 10:40 AM 10/07/2022

## 2022-10-09 ENCOUNTER — Encounter: Payer: Self-pay | Admitting: Nurse Practitioner

## 2023-04-28 ENCOUNTER — Ambulatory Visit
Admission: RE | Admit: 2023-04-28 | Discharge: 2023-04-28 | Disposition: A | Discharge status: Home / Self Care | Source: Ambulatory Visit

## 2023-04-28 VITALS — BP 114/74 | HR 108 | Temp 98.7°F | Resp 18

## 2023-04-28 DIAGNOSIS — J069 Acute upper respiratory infection, unspecified: Secondary | ICD-10-CM

## 2023-04-28 LAB — POC COVID19/FLU A&B COMBO
Covid Antigen, POC: NEGATIVE
Influenza A Antigen, POC: NEGATIVE
Influenza B Antigen, POC: NEGATIVE

## 2023-04-28 NOTE — Discharge Instructions (Addendum)
 The COVID and flu tests are negative.   Take Tylenol or ibuprofen as needed for fever or discomfort.  Take plain Mucinex as needed for congestion.  Rest and keep yourself hydrated.    Follow-up with your primary care provider if your symptoms are not improving.

## 2023-04-28 NOTE — ED Triage Notes (Signed)
 Patient to Urgent Care with complaints of sore throat/ hoarseness/ productive cough. Denies any known fevers.   Symptoms started Monday. Flu exposed.   Meds: Coricidin.
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
## 2023-09-16 DIAGNOSIS — I700 Atherosclerosis of aorta: Secondary | ICD-10-CM | POA: Insufficient documentation

## 2023-09-16 DIAGNOSIS — I2510 Atherosclerotic heart disease of native coronary artery without angina pectoris: Secondary | ICD-10-CM | POA: Insufficient documentation
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
## 2023-09-17 ENCOUNTER — Encounter: Payer: Self-pay | Admitting: Cardiovascular Disease

## 2023-09-17 ENCOUNTER — Ambulatory Visit: Attending: Cardiovascular Disease | Admitting: Cardiovascular Disease

## 2023-09-17 VITALS — BP 128/78 | HR 74 | Ht 62.0 in | Wt 190.0 lb

## 2023-09-17 DIAGNOSIS — I700 Atherosclerosis of aorta: Secondary | ICD-10-CM | POA: Diagnosis not present

## 2023-09-17 DIAGNOSIS — I10 Essential (primary) hypertension: Secondary | ICD-10-CM | POA: Diagnosis not present

## 2023-09-17 DIAGNOSIS — I2510 Atherosclerotic heart disease of native coronary artery without angina pectoris: Secondary | ICD-10-CM

## 2023-09-17 DIAGNOSIS — E118 Type 2 diabetes mellitus with unspecified complications: Secondary | ICD-10-CM | POA: Diagnosis not present

## 2023-09-17 DIAGNOSIS — E782 Mixed hyperlipidemia: Secondary | ICD-10-CM

## 2023-09-17 NOTE — Patient Instructions (Addendum)

## 2023-10-09 LAB — HM MAMMOGRAPHY

## 2023-10-11 ENCOUNTER — Ambulatory Visit (INDEPENDENT_AMBULATORY_CARE_PROVIDER_SITE_OTHER): Admitting: Nurse Practitioner

## 2023-10-11 ENCOUNTER — Other Ambulatory Visit (HOSPITAL_COMMUNITY)
Admission: RE | Admit: 2023-10-11 | Discharge: 2023-10-11 | Disposition: A | Discharge status: Home / Self Care | Source: Ambulatory Visit | Attending: Nurse Practitioner | Admitting: Nurse Practitioner

## 2023-10-11 ENCOUNTER — Encounter: Payer: Self-pay | Admitting: Nurse Practitioner

## 2023-10-11 VITALS — BP 108/78 | HR 85 | Ht 61.81 in | Wt 192.4 lb

## 2023-10-11 DIAGNOSIS — Z1331 Encounter for screening for depression: Secondary | ICD-10-CM

## 2023-10-11 DIAGNOSIS — Z01419 Encounter for gynecological examination (general) (routine) without abnormal findings: Secondary | ICD-10-CM

## 2023-10-11 DIAGNOSIS — Z30431 Encounter for routine checking of intrauterine contraceptive device: Secondary | ICD-10-CM | POA: Diagnosis not present

## 2023-10-11 DIAGNOSIS — Z124 Encounter for screening for malignant neoplasm of cervix: Secondary | ICD-10-CM | POA: Diagnosis present

## 2023-10-11 DIAGNOSIS — Z7689 Persons encountering health services in other specified circumstances: Secondary | ICD-10-CM

## 2023-10-11 NOTE — Progress Notes (Signed)
   Debra Gibson 1970/11/13 [id number]   History:  53 y.o. G2P0011 presents for annual exam. Mirena  IUD 04/2020, amenorrheic. Normal pap and mammogram history. T2DM managed by primary care.   Gynecologic History No LMP recorded. (Menstrual status: IUD).   Contraception: abstinence and IUD Sexually active: No  Health maintenance Last Pap: 12/12/2018. Results were: Normal neg HPV Last mammogram: 10/09/2023. No report yet.  Last colonoscopy: 2023. Results were: Polyps per patient, 10-year recall Last Dexa: Not indicated  Past medical history, past surgical history, family history and social history were all reviewed and documented in the EPIC chart. 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  ROS:  A ROS was performed and pertinent positives and negatives are included.  Exam:  Vitals:   10/11/23 1010  BP: 108/78  Pulse: 85  SpO2: 97%  Weight: 192 lb 6.4 oz (87.3 kg)  Height: 5' 1.81 (1.57 m)     Body mass index is 35.41 kg/m.  General appearance:  Normal Thyroid:  Symmetrical, normal in size, without palpable masses or nodularity. Respiratory  Auscultation:  Clear without wheezing or rhonchi Cardiovascular  Auscultation:  Regular rate, without rubs, murmurs or gallops  Edema/varicosities:  Not grossly evident Abdominal  Soft,nontender, without masses, guarding or rebound.  Liver/spleen:  No organomegaly noted  Hernia:  None appreciated  Skin  Inspection:  Grossly normal Breasts: Examined lying and sitting.   Right: Without masses, retractions, discharge or axillary adenopathy.   Left: Without masses, retractions, discharge or axillary adenopathy. Pelvic: External genitalia:  no lesions              Urethra:  normal appearing urethra with no masses, tenderness or lesions              Bartholins and Skenes: normal                 Vagina: Atrophic changes, stage 1 uterine prolaps              Cervix:  Normal, IUD string visualized Bimanual Exam:  Uterus:  no masses or tenderness              Adnexa: no mass, fullness, tenderness              Rectovaginal: Deferred              Anus:  normal, no lesions  Geni Pica, CMA present as chaperone.   Assessment/Plan:  53 y.o. G2P0011 for annual exam.   Well woman exam with routine gynecological exam - Education provided on SBEs, importance of preventative screenings, current guidelines, high calcium diet, regular exercise, and multivitamin daily. Labs with PCP.   Encounter for routine checking of intrauterine contraceptive device (IUD) -Mirena  04/2020, amenorrheic. Aware of 8-year FDA approval.   Encounter for routine checking of intrauterine contraceptive device (IUD)  Cervical cancer screening - Plan: Cytology - PAP( [place]). Normal pap history. Pap today per guidelines.   Encounter for weight management - discussed importance of daily calorie deficit for weight loss, using app to track intake, intermittent fasting, increasing exercise. BMI 35.  Screening for breast cancer - Normal mammogram history.  Continue annual screenings. Normal breast exam.  Screening for colon cancer - 2023 colonoscopy. Will repeat at 10-year interval per GI recommendation.   Return in about 1 year (around 10/10/2024) for Annual.      Debra Gibson [facility], 10:48 AM 10/11/2023

## 2023-10-12 ENCOUNTER — Ambulatory Visit: Payer: Self-pay | Admitting: Nurse Practitioner

## 2023-10-12 LAB — CYTOLOGY - PAP
Adequacy: ABSENT
Comment: NEGATIVE
Diagnosis: NEGATIVE
High risk HPV: NEGATIVE

## 2023-10-13 ENCOUNTER — Encounter: Payer: Self-pay | Admitting: Nurse Practitioner

## 2023-10-14 ENCOUNTER — Ambulatory Visit: Admitting: Nurse Practitioner
